# Patient Record
Sex: Female | Born: 1955 | Race: Black or African American | Hispanic: No | Smoking: Former smoker
Health system: Southern US, Community
[De-identification: ages and names within clinical notes are randomized; demographics above are authoritative.]

## PROBLEM LIST (undated history)

## (undated) DIAGNOSIS — I10 Essential (primary) hypertension: Secondary | ICD-10-CM

## (undated) DIAGNOSIS — E039 Hypothyroidism, unspecified: Secondary | ICD-10-CM

## (undated) HISTORY — PX: TONSILLECTOMY: SUR1361

## (undated) HISTORY — PX: ABDOMINAL HYSTERECTOMY: SHX81

## (undated) HISTORY — PX: CORONARY STENT INTERVENTION: CATH118234

## (undated) HISTORY — PX: APPENDECTOMY: SHX54

---

## 2007-12-27 ENCOUNTER — Inpatient Hospital Stay (HOSPITAL_COMMUNITY): Admission: EM | Admit: 2007-12-27 | Discharge: 2007-12-30 | Payer: Self-pay | Admitting: Emergency Medicine

## 2007-12-27 ENCOUNTER — Ambulatory Visit: Payer: Self-pay | Admitting: Hospitalist

## 2007-12-30 ENCOUNTER — Encounter (INDEPENDENT_AMBULATORY_CARE_PROVIDER_SITE_OTHER): Payer: Self-pay | Admitting: Internal Medicine

## 2008-01-06 ENCOUNTER — Ambulatory Visit: Payer: Self-pay | Admitting: Gastroenterology

## 2008-06-19 ENCOUNTER — Telehealth: Payer: Self-pay | Admitting: Internal Medicine

## 2009-10-12 ENCOUNTER — Emergency Department (HOSPITAL_COMMUNITY): Admission: EM | Admit: 2009-10-12 | Discharge: 2009-10-12 | Payer: Self-pay | Admitting: Emergency Medicine

## 2010-05-28 ENCOUNTER — Emergency Department (HOSPITAL_BASED_OUTPATIENT_CLINIC_OR_DEPARTMENT_OTHER)
Admission: EM | Admit: 2010-05-28 | Discharge: 2010-05-28 | Payer: Self-pay | Source: Home / Self Care | Admitting: Emergency Medicine

## 2010-05-28 ENCOUNTER — Ambulatory Visit: Payer: Self-pay | Admitting: Diagnostic Radiology

## 2010-11-22 ENCOUNTER — Emergency Department (HOSPITAL_BASED_OUTPATIENT_CLINIC_OR_DEPARTMENT_OTHER)
Admission: EM | Admit: 2010-11-22 | Discharge: 2010-11-22 | Payer: Self-pay | Source: Home / Self Care | Admitting: Emergency Medicine

## 2010-11-22 ENCOUNTER — Inpatient Hospital Stay (HOSPITAL_COMMUNITY)
Admission: EM | Admit: 2010-11-22 | Discharge: 2010-11-25 | Payer: Self-pay | Source: Home / Self Care | Attending: Internal Medicine | Admitting: Internal Medicine

## 2010-11-23 ENCOUNTER — Encounter (INDEPENDENT_AMBULATORY_CARE_PROVIDER_SITE_OTHER): Payer: Self-pay | Admitting: Internal Medicine

## 2011-01-02 ENCOUNTER — Encounter: Payer: Self-pay | Admitting: Internal Medicine

## 2011-02-20 LAB — GLUCOSE, CAPILLARY
Glucose-Capillary: 154 mg/dL — ABNORMAL HIGH (ref 70–99)
Glucose-Capillary: 291 mg/dL — ABNORMAL HIGH (ref 70–99)
Glucose-Capillary: 308 mg/dL — ABNORMAL HIGH (ref 70–99)

## 2011-02-20 LAB — CBC
HCT: 37.9 % (ref 36.0–46.0)
Hemoglobin: 12.7 g/dL (ref 12.0–15.0)
MCHC: 33.5 g/dL (ref 30.0–36.0)
MCV: 87.9 fL (ref 78.0–100.0)
RBC: 4.31 MIL/uL (ref 3.87–5.11)
RDW: 12.9 % (ref 11.5–15.5)
WBC: 4.1 10*3/uL (ref 4.0–10.5)

## 2011-02-20 LAB — BASIC METABOLIC PANEL
Calcium: 8.8 mg/dL (ref 8.4–10.5)
Creatinine, Ser: 0.75 mg/dL (ref 0.4–1.2)
Sodium: 138 mEq/L (ref 135–145)

## 2011-02-21 LAB — URINALYSIS, ROUTINE W REFLEX MICROSCOPIC
Bilirubin Urine: NEGATIVE
Glucose, UA: >1000 mg/dL — AB
Hgb urine dipstick: NEGATIVE
Ketones, ur: NEGATIVE mg/dL
Protein, ur: NEGATIVE mg/dL

## 2011-02-21 LAB — CBC
HCT: 38.5 % (ref 36.0–46.0)
Hemoglobin: 12.5 g/dL (ref 12.0–15.0)
MCH: 29.4 pg (ref 26.0–34.0)
MCV: 88 fL (ref 78.0–100.0)
RBC: 4.62 MIL/uL (ref 3.87–5.11)
RBC: 4.25 MIL/uL (ref 3.87–5.11)

## 2011-02-21 LAB — DIFFERENTIAL
Basophils Relative: 0 % (ref 0–1)
Eosinophils Absolute: 0.1 10*3/uL (ref 0.0–0.7)
Eosinophils Relative: 1 % (ref 0–5)
Lymphocytes Relative: 29 % (ref 12–46)
Lymphs Abs: 1.5 10*3/uL (ref 0.7–4.0)
Monocytes Absolute: 0.6 10*3/uL (ref 0.1–1.0)
Monocytes Relative: 12 % (ref 3–12)
Neutro Abs: 3.2 10*3/uL (ref 1.7–7.7)

## 2011-02-21 LAB — GLUCOSE, CAPILLARY
Glucose-Capillary: 394 mg/dL — ABNORMAL HIGH (ref 70–99)
Glucose-Capillary: 193 mg/dL — ABNORMAL HIGH (ref 70–99)
Glucose-Capillary: 410 mg/dL — ABNORMAL HIGH (ref 70–99)

## 2011-02-21 LAB — URINE CULTURE
Colony Count: 80000
Colony Count: 45000
Culture  Setup Time: 201112132132
Culture  Setup Time: 201112140541

## 2011-02-21 LAB — CARDIAC PANEL(CRET KIN+CKTOT+MB+TROPI)
Relative Index: 1.1 (ref 0.0–2.5)
Relative Index: INVALID (ref 0.0–2.5)
Total CK: 99 U/L (ref 7–177)
Troponin I: 0.01 ng/mL (ref 0.00–0.06)

## 2011-02-21 LAB — COMPREHENSIVE METABOLIC PANEL
ALT: 16 U/L (ref 0–35)
AST: 13 U/L (ref 0–37)
BUN: 11 mg/dL (ref 6–23)
CO2: 28 mEq/L (ref 19–32)
Calcium: 9.6 mg/dL (ref 8.4–10.5)
Chloride: 102 mEq/L (ref 96–112)
Creatinine, Ser: 0.7 mg/dL (ref 0.4–1.2)
GFR calc non Af Amer: >60 mL/min (ref >60)
Total Protein: 7 g/dL (ref 6.0–8.3)

## 2011-02-21 LAB — BASIC METABOLIC PANEL
BUN: 9 mg/dL (ref 6–23)
CO2: 29 mEq/L (ref 19–32)
Calcium: 9.2 mg/dL (ref 8.4–10.5)
Creatinine, Ser: 0.81 mg/dL (ref 0.4–1.2)
GFR calc Af Amer: >60 mL/min (ref >60)
Glucose, Bld: 312 mg/dL — ABNORMAL HIGH (ref 70–99)

## 2011-02-21 LAB — PROTIME-INR: INR: 0.95 (ref 0.00–1.49)

## 2011-02-21 LAB — RAPID URINE DRUG SCREEN, HOSP PERFORMED
Opiates: NOT DETECTED
Tetrahydrocannabinol: POSITIVE — AB

## 2011-02-21 LAB — MAGNESIUM: Magnesium: 2 mg/dL (ref 1.5–2.5)

## 2011-02-21 LAB — LIPASE, BLOOD: Lipase: 26 U/L (ref 11–59)

## 2011-02-21 LAB — POCT CARDIAC MARKERS: Troponin i, poc: <0.05 ng/mL (ref 0.00–0.09)

## 2011-02-21 LAB — URINE MICROSCOPIC-ADD ON

## 2011-02-26 LAB — COMPREHENSIVE METABOLIC PANEL
ALT: 18 U/L (ref 0–35)
Alkaline Phosphatase: 86 U/L (ref 39–117)
CO2: 28 mEq/L (ref 19–32)
Glucose, Bld: 196 mg/dL — ABNORMAL HIGH (ref 70–99)
Potassium: 3.7 mEq/L (ref 3.5–5.1)
Sodium: 140 mEq/L (ref 135–145)
Total Protein: 6.9 g/dL (ref 6.0–8.3)

## 2011-02-26 LAB — URINALYSIS, ROUTINE W REFLEX MICROSCOPIC
Bilirubin Urine: NEGATIVE
Bilirubin Urine: NEGATIVE
Glucose, UA: 250 mg/dL — AB
Glucose, UA: 500 mg/dL — AB
Hgb urine dipstick: NEGATIVE
Hgb urine dipstick: NEGATIVE
Ketones, ur: 15 mg/dL — AB
Ketones, ur: NEGATIVE mg/dL
Nitrite: NEGATIVE
Protein, ur: 30 mg/dL — AB
pH: 6 (ref 5.0–8.0)

## 2011-02-26 LAB — DIFFERENTIAL
Basophils Relative: 1 % (ref 0–1)
Eosinophils Absolute: 0 10*3/uL (ref 0.0–0.7)
Monocytes Relative: 18 % — ABNORMAL HIGH (ref 3–12)
Neutrophils Relative %: 60 % (ref 43–77)

## 2011-02-26 LAB — URINE CULTURE
Colony Count: NO GROWTH
Culture: NO GROWTH

## 2011-02-26 LAB — CBC
Hemoglobin: 12.7 g/dL (ref 12.0–15.0)
RBC: 4.33 MIL/uL (ref 3.87–5.11)
RDW: 12.9 % (ref 11.5–15.5)
WBC: 3.7 10*3/uL — ABNORMAL LOW (ref 4.0–10.5)

## 2011-02-26 LAB — HEMOCCULT GUIAC POC 1CARD (OFFICE): Fecal Occult Bld: POSITIVE

## 2011-02-26 LAB — URINE MICROSCOPIC-ADD ON

## 2011-04-28 NOTE — Discharge Summary (Signed)
Catherine Bowman, DUTT NO.:  [id number]   MEDICAL RECORD NO.:  [id number]          PATIENT TYPE:  INP   LOCATION:  4741                         FACILITY:  [facility]   PHYSICIAN:  Rosanna Randy, MDDATE OF BIRTH:  Jun 12, 1956   DATE OF ADMISSION:  12/27/2007  DATE OF DISCHARGE:  12/30/2007                               DISCHARGE SUMMARY   DISCHARGE DIAGNOSES:  1. Hypertension.  2. Diabetes mellitus, type 2.  3. Obstructive sleep apnea.  4. History of hysterectomy.  5. History of gastrointestinal bleed in the past.   DISCHARGE MEDICATIONS:  1. Levemir 30 units subcutaneously at bedtime.  2. Metformin 500 mg one tablet by mouth twice a day.  3. Actos 45 mg one tablet by mouth once a day.  4. Fish oil one tablet by mouth daily.  5. Lipitor 80 mg by mouth daily.  6. Benazepril 10 mg one tablet by mouth once a day.  7. Synthroid 135 mcg by mouth daily.  8. Multivitamin one tablet by mouth daily.  9. Enteric-coated aspirin 81 mg by mouth daily.  10.Meloxicam 50 mg one tablet by mouth daily.  11.Prilosec 40 mg one tablet by mouth twice a day.   DISPOSITION AND FOLLOWUP:  The patient was discharge in stable condition  with a followup appointment with her primary care physician, Dr. Emmaline Life, on January 13, 2008 at 9:15 in the morning.  It will be important  at that moment to have a reconciliation of the medications that the  patient was using before hospitalization and after hospitalization. Will  be also important to counsel the pt regarding education and compliance  with her medications.  It will be also important to recheck in the  future a hemoglobin A1c for evaluation of the new doses of her insulin  regarding response to the blood glucose control.  It will be important  to check a fasting lipid profile to reevaluate cholesterol levels, and  it will also be important to check in 6 months another ultrasound of the  abdomen with special visualization of  the liver regarding a little cyst  that we found on an ultrasound performed in this hospitalization.   PROCEDURES PERFORMED:  The patient had a 2-D ECHO: with an overall left  ventricular systolic function that was normal.  Left ventricular  ejection fraction was estimated to be between 55-60%.  There were no  left ventricular regional wall motion abnormalities.  The patient also received a CHEST X-RAY: that was negative for acute  cardiopulmonary disease.  The patient had an EKG: that demonstrated sinus rhythm with first-degree  atrioventricular block that is not new.  The patient received an ABDOMEN ULTRASOUND: that demonstrated no acute  abdominal findings, with a small hypoechoic focus within the liver that  most likely represents a small hemangioma versus local fatty  infiltration less likely.  Also, it will be important to consider a  small hepatic adenoma. There was also the presence of a simple cyst  within the left kidney.  ABDOMINAL X-RAY: demonstrated a moderate amount of feces throughout the  colon and calcification  to right of L3-L4 that could be phlebolith  versus right ureteral calculus.  The patient had RIGHT SHOULDER X-RAY: regarding the pain that she was  presenting during this hospitalization, that was completely negative for  any abnormality.  The patient had an ENDOSCOPY: that demonstrated Mallory-Weiss tears and  duodenitis with mild gastritis.   CONSULTATIONS:  Gastroenterology, Barbette Hair. Arlyce Dice, M.D., [facility]   BRIEF HISTORY AND PHYSICAL:  Ms. Catherine Bowman is a 55 year old African American  woman with past medical history of hypertension, diabetes mellitus type  2, obstructive sleep apnea, and a remote history of gastrointestinal  bleed who now presents with chest pain 2 days prior to admission and  vomiting with hematemesis 3 times 1 day prior to admission and the  morning of admission.  The chest pain is actually described mainly in  the epigastric area involving the  whole chest, radiating into the back,  8/10 in intensity, described as a sharp pain regarding quality.  She had  associated shortness of breath, nausea and vomiting, and also  diaphoresis.  The patient denies any asymmetric swelling of the legs and  reports that a recent Doppler was negative for SVT and DVTs but  demonstrated positive Baker's cyst bilaterally.   PHYSICAL EXAMINATION:  VITAL SIGNS:  Temperature 97.8, blood pressure  135/75, heart rate 59, respiratory rate 20, oxygen saturation 99%.  HEENT:  Eyes anicteric.  NECK:  Demonstrating a slightly hyperthyromegaly, no bruits.  RESPIRATORY:  Positive rhonchi.  Other than that, clear to auscultation  with good air movement.  CARDIOVASCULAR:  Regular rate and rhythm.  No murmur, gallop, or rubs.  The pain was slightly reproducible by palpation across the chest but  mostly present in the middle of the chest close to the epigastric  section.  ABDOMEN:  Obese.  Positive bowel sounds.  Soft.  Positive tender to  palpation on the right upper quadrant and epigastric pain.  EXTREMITIES:  No edema.  Negative Homans sign.  Good pulses bilaterally.   LABORATORY DATA:  On admission, the patient's labs demonstrated a sodium  of 137, potassium 4.0, chloride 103, bicarb 29, BUN 12, creatinine 0.71,  glucose 253.  CBC:  Hemoglobin 11.2, white blood cells 9.1, platelets  155.  Point-of-care markers negative x1.  Cardiac enzymes negative x1.  Rapid strep test was negative.  Influenza was negative.  TSH 15.3.   Chest x-ray without acute findings.   HOSPITAL COURSE BY PROBLEM:  Problem 1.  Chest pain.  Most likely  related to a gastrointestinal source since the patient's  electrocardiogram only demonstrated first-degree AV block that was not  new and no other findings for acute ischemia.  Also, the patient had a 2-  D echocardiogram which was completely normal and had 3 sets of cardiac  enzymes that were also negative.  Also, because of the  location of the  pain that was most likely in the epigastric region and the association  with nausea, vomiting, and hematemesis, we decided to consult  gastroenterology in order to have an endoscopy and rule out any peptic  ulcer disease, gastritis, or esophagitis.  After the endoscopy was  performed, we discovered that the patient had Mallory-Weiss tears in the  esophagus that were not actively bleeding, and also duodenitis.  The  patient was started on Prilosec 40mg  twice a day and was instructed to  avoid citric juices and spicy food for 1 week in order to achieve a  complete cure of the ulcers in the esophagus.  At the moment that we  discharged the patient, the patient denies nausea, denies vomiting, and  the chest pain was completely resolved.   Problem 2.  Diabetes mellitus.  The patient was started during the  hospitalization on a sliding scale insulin with meal coverage and Lantus  at night.  The blood sugar was 243 with CBGs around 200s.  We actually  ordered a hemoglobin A1c that demonstrated 11.3 level, showing poorly  controlled diabetes.  We decided to add glipizide 5 mg by mouth twice a  day for better control, and we increased the doses of her medications at  the moment that we discharge her, with a followup appointment with her  primary care physician.  At the moment of discharge, the patient was  using metformin 500 mg one tablet by mouth twice a day and Actos 45 mg  one tablet by mouth once a day.  She was also using Levemir insulin 30  units subcutaneously at bedtime.   Problem 3.  Asthma.  The patient was completely stable regarding this  problem during the hospitalization and was receiving her home  medications with Xopenex every 6 hours p.r.n.   Problem 4.  Hypertension.  The blood pressure was pretty well-  controlled, and we just continued the same regimen that she was using at  home.   Problem 5.  Hyperlipidemia.  The patient was actually using Lipitor 80   mg.  We ordered a fasting lipid profile and decided that the primary  care physician at her next appointment could address any changes.  During this hospitalization, we checked the liver function regarding the  use of this medication that was completely normal.   Problem 6.  Hypothyroidism with a TSH of 15.3.  The patient was using  Levothyroxine, just 25 mcg at that moment.  With this high level of TSH,  we increased the patient to 135 mcg by mouth daily, with a followup  appointment with her primary care physician regarding adjustment of the  medication for the hypothyroidism.  The patient was completely  asymptomatic for hypothyroidism during the hospitalization.   Problem 7.  Osteoarthritis: We decided to change the naproxen 500 mg  that the patient was using for the OA, to meloxicam 15mg , 1 tablet by  mouth daily and we also started the patient on Prilosec 40 mg 1 tablet  by mouth twice a day.   VITAL SIGNS AND LABS ON DISCHARGE:  Temperature 97, blood pressure  106/55, heart rate 58, respiratory rate 20, oxygen saturation 100% on  room air.   CBC showed hemoglobin 12.1, hematocrit 36, platelets 138, white blood  cells 4.5.  Sodium 139, potassium 4.0, chloride 102, bicarb 31, BUN 7,  creatinine 0.81.  The glucose at the moment of discharge was 200.  Calcium 9.2.      Rosanna Randy, MD  Electronically Signed     CEM/MEDQ  D:  01/25/2008  T:  01/27/2008  Job:  [id number]   cc:   Emmaline Life, M.D.

## 2011-05-01 ENCOUNTER — Emergency Department (HOSPITAL_BASED_OUTPATIENT_CLINIC_OR_DEPARTMENT_OTHER)
Admission: EM | Admit: 2011-05-01 | Discharge: 2011-05-01 | Disposition: A | Discharge status: Home / Self Care | Payer: Self-pay | Attending: Emergency Medicine | Admitting: Emergency Medicine

## 2011-05-01 ENCOUNTER — Emergency Department (INDEPENDENT_AMBULATORY_CARE_PROVIDER_SITE_OTHER): Payer: Self-pay

## 2011-05-01 DIAGNOSIS — J45909 Unspecified asthma, uncomplicated: Secondary | ICD-10-CM | POA: Insufficient documentation

## 2011-05-01 DIAGNOSIS — I10 Essential (primary) hypertension: Secondary | ICD-10-CM | POA: Insufficient documentation

## 2011-05-01 DIAGNOSIS — E119 Type 2 diabetes mellitus without complications: Secondary | ICD-10-CM | POA: Insufficient documentation

## 2011-05-01 DIAGNOSIS — J069 Acute upper respiratory infection, unspecified: Secondary | ICD-10-CM | POA: Insufficient documentation

## 2011-05-01 DIAGNOSIS — R05 Cough: Secondary | ICD-10-CM

## 2011-05-01 DIAGNOSIS — R0989 Other specified symptoms and signs involving the circulatory and respiratory systems: Secondary | ICD-10-CM

## 2011-05-01 DIAGNOSIS — J40 Bronchitis, not specified as acute or chronic: Secondary | ICD-10-CM | POA: Insufficient documentation

## 2011-08-08 ENCOUNTER — Emergency Department (HOSPITAL_BASED_OUTPATIENT_CLINIC_OR_DEPARTMENT_OTHER)
Admission: EM | Admit: 2011-08-08 | Discharge: 2011-08-08 | Disposition: A | Discharge status: Home / Self Care | Payer: Self-pay | Attending: Emergency Medicine | Admitting: Emergency Medicine

## 2011-08-08 DIAGNOSIS — I10 Essential (primary) hypertension: Secondary | ICD-10-CM | POA: Insufficient documentation

## 2011-08-08 DIAGNOSIS — R5381 Other malaise: Secondary | ICD-10-CM | POA: Insufficient documentation

## 2011-08-08 DIAGNOSIS — R5383 Other fatigue: Secondary | ICD-10-CM | POA: Insufficient documentation

## 2011-08-08 DIAGNOSIS — E1169 Type 2 diabetes mellitus with other specified complication: Secondary | ICD-10-CM | POA: Insufficient documentation

## 2011-08-08 DIAGNOSIS — E039 Hypothyroidism, unspecified: Secondary | ICD-10-CM | POA: Insufficient documentation

## 2011-08-08 DIAGNOSIS — E162 Hypoglycemia, unspecified: Secondary | ICD-10-CM

## 2011-08-08 DIAGNOSIS — R51 Headache: Secondary | ICD-10-CM | POA: Insufficient documentation

## 2011-08-08 DIAGNOSIS — R209 Unspecified disturbances of skin sensation: Secondary | ICD-10-CM | POA: Insufficient documentation

## 2011-08-08 DIAGNOSIS — M79609 Pain in unspecified limb: Secondary | ICD-10-CM

## 2011-08-08 HISTORY — DX: Essential (primary) hypertension: I10

## 2011-08-08 HISTORY — DX: Hypothyroidism, unspecified: E03.9

## 2011-08-08 LAB — GLUCOSE, CAPILLARY
Glucose-Capillary: 49 mg/dL — ABNORMAL LOW (ref 70–99)
Glucose-Capillary: 130 mg/dL — ABNORMAL HIGH (ref 70–99)

## 2011-08-08 MED ORDER — METOCLOPRAMIDE HCL 5 MG/ML IJ SOLN
10.0000 mg | Freq: Once | INTRAMUSCULAR | Status: AC
  Administered 2011-08-08: 10 mg via INTRAMUSCULAR
  Filled 2011-08-08: qty 2

## 2011-08-08 MED ORDER — KETOROLAC TROMETHAMINE 60 MG/2ML IM SOLN
60.0000 mg | Freq: Once | INTRAMUSCULAR | Status: AC
  Administered 2011-08-08: 60 mg via INTRAMUSCULAR
  Filled 2011-08-08: qty 2

## 2011-08-08 MED ORDER — DIPHENHYDRAMINE HCL 50 MG/ML IJ SOLN
25.0000 mg | Freq: Once | INTRAMUSCULAR | Status: AC
  Administered 2011-08-08: 25 mg via INTRAMUSCULAR
  Filled 2011-08-08: qty 1

## 2011-08-08 NOTE — ED Notes (Signed)
Hx of diabetes; states both hands numb and tingling and right leg gives way and left shoulder pain; takes nueurontin for neuropathy; drove self here

## 2011-08-08 NOTE — ED Notes (Signed)
Meal tray provided.

## 2011-08-08 NOTE — ED Notes (Signed)
Pt noted to be shaky.  PO OF with sugar, crackers an peanut butter provided.  Skin warm and dry.  She states she did not eat breakfast this am.

## 2011-08-08 NOTE — ED Notes (Signed)
Pt states that she has been having trouble controlling her blood sugar for the past month since her doctor increased her dose of lantus from 60 to 80.  She says that her blood sugar is usually high, but she has been having hypoglycemic episodes in the morning.  She takes her lantus at night.  She also takes metformen.  She was prescribed novolog, but does not take it.  She has been feeling weak and shaky in the morning which has made it difficult for her to prepare breakfast for herself.  She states that she had a piece of cake and a few peppermints for breakfast this morning.

## 2011-08-08 NOTE — ED Provider Notes (Signed)
History     CSN: [id number] Arrival date & time: 08/08/2011 10:43 AM  Chief Complaint  Patient presents with  . Extremity Weakness   HPI Comments: Pt states that she has had a lot of tingling in her extremities which often send shooting pain in her extremities and it make her feel like her arms and legs are going to give out on someone pt states that this has been going on over the last month:pt states that she has been feeling bad since they put her on the neurontin and her insulin was changed:pt states that has also had a headache for the last couple of days which has been resolved when she takes aleve, but she has it currently  Patient is a 55 y.o. female presenting with extremity weakness. The history is provided by the patient. No language interpreter was used.  Extremity Weakness This is a recurrent problem. The current episode started today. The problem occurs constantly. The problem has been unchanged. Pertinent negatives include no abdominal pain, fever, neck pain, numbness, rash, sore throat, vomiting or weakness. The symptoms are aggravated by nothing.    Past Medical History  Diagnosis Date  . Hypothyroid   . Diabetes mellitus   . Hypertension   . Asthma     Past Surgical History  Procedure Date  . Tonsillectomy   . Appendectomy   . Abdominal hysterectomy     No family history on file.  History  Substance Use Topics  . Smoking status: Former Games developer  . Smokeless tobacco: Not on file  . Alcohol Use: No    OB History    Grav Para Term Preterm Abortions TAB SAB Ect Mult Living                  Review of Systems  Constitutional: Negative for fever.  HENT: Negative for sore throat and neck pain.   Gastrointestinal: Negative for vomiting and abdominal pain.  Musculoskeletal: Positive for extremity weakness.  Skin: Negative for rash.  Neurological: Negative for weakness and numbness.  All other systems reviewed and are negative.    Physical Exam  BP 128/84   Pulse 67  Temp(Src) 98.5 F (36.9 C) (Oral)  Resp 20  SpO2 100%  Physical Exam  Nursing note and vitals reviewed. Constitutional: She is oriented to person, place, and time. She appears well-developed and well-nourished.  HENT:  Head: Normocephalic and atraumatic.  Eyes: Pupils are equal, round, and reactive to light.  Neck: Normal range of motion. Neck supple.  Cardiovascular: Normal rate and regular rhythm.   Pulmonary/Chest: Effort normal and breath sounds normal.  Abdominal: Soft. Bowel sounds are normal.  Musculoskeletal: Normal range of motion.  Neurological: She is alert and oriented to person, place, and time. She exhibits normal muscle tone.  Skin: Skin is warm and dry.  Psychiatric: She has a normal mood and affect.    ED Course  Procedures  MDM Pt is feeling better at this time:pt feed after the low blood sugar:pt headache has resolved:based on history symptoms likely related to paratesias:discussed with pt proper food intake etc.. With her lantus:pt verbalizes understanding      Teressa Lower, NP 08/08/11 [phone]

## 2011-08-10 NOTE — ED Provider Notes (Signed)
Medical screening examination/treatment/procedure(s) were performed by non-physician practitioner and as supervising physician I was immediately available for consultation/collaboration.  Gerhard Munch, MD, MA  Gerhard Munch, MD 08/10/11 [phone]

## 2011-09-01 LAB — I-STAT 8, (EC8 V) (CONVERTED LAB)
Bicarbonate: 27.1 — ABNORMAL HIGH
Glucose, Bld: 384 — ABNORMAL HIGH
HCT: 42
Hemoglobin: 14.3
Operator id: 198171
Sodium: 135
TCO2: 28
pCO2, Ven: 35.8 — ABNORMAL LOW

## 2011-09-01 LAB — URINALYSIS, ROUTINE W REFLEX MICROSCOPIC
Bilirubin Urine: NEGATIVE
Nitrite: NEGATIVE
Specific Gravity, Urine: 1.025
Urobilinogen, UA: 1

## 2011-09-01 LAB — HEPATIC FUNCTION PANEL
Albumin: 3.3 — ABNORMAL LOW
Alkaline Phosphatase: 64
Total Protein: 5.4 — ABNORMAL LOW

## 2011-09-01 LAB — PROTIME-INR
INR: 1
Prothrombin Time: 12.9

## 2011-09-01 LAB — APTT: aPTT: 30

## 2011-09-01 LAB — DIFFERENTIAL
Basophils Absolute: 0
Basophils Absolute: 0
Basophils Absolute: 0
Basophils Relative: 0
Basophils Relative: 0
Basophils Relative: 0
Eosinophils Relative: 1
Lymphocytes Relative: 15
Lymphocytes Relative: 15
Monocytes Absolute: 0.7
Monocytes Absolute: 0.9
Neutro Abs: 5.3
Neutro Abs: 6.3
Neutro Abs: 7.1
Neutrophils Relative %: 78 — ABNORMAL HIGH

## 2011-09-01 LAB — COMPREHENSIVE METABOLIC PANEL
Albumin: 3.8
Alkaline Phosphatase: 71
BUN: 12
Calcium: 9.1
Glucose, Bld: 253 — ABNORMAL HIGH
Potassium: 4
Total Protein: 6.1

## 2011-09-01 LAB — CBC
HCT: 34.2 — ABNORMAL LOW
HCT: 36 — ABNORMAL LOW
Hemoglobin: 11.5 — ABNORMAL LOW
Hemoglobin: 11.7 — ABNORMAL LOW
Hemoglobin: 12.1 — ABNORMAL LOW
MCHC: 33.5
MCHC: 33.6
MCHC: 34
Platelets: 131 — ABNORMAL LOW
Platelets: 133 — ABNORMAL LOW
Platelets: 138 — ABNORMAL LOW
Platelets: 155
RBC: 3.85 — ABNORMAL LOW
RBC: 3.96 — ABNORMAL LOW
RBC: 4.09 — ABNORMAL LOW
RDW: 13.7
RDW: 13.9
RDW: 14
RDW: 14.2
WBC: 4.5
WBC: 7

## 2011-09-01 LAB — RAPID STREP SCREEN (MED CTR MEBANE ONLY): Streptococcus, Group A Screen (Direct): NEGATIVE

## 2011-09-01 LAB — CULTURE, BLOOD (ROUTINE X 2)
Culture: NO GROWTH
Culture: NO GROWTH

## 2011-09-01 LAB — BASIC METABOLIC PANEL
BUN: 10
CO2: 30
Calcium: 8.8
Calcium: 8.9
Creatinine, Ser: 0.7
Creatinine, Ser: 0.72
GFR calc Af Amer: >60
GFR calc Af Amer: >60
GFR calc non Af Amer: >60
GFR calc non Af Amer: >60
Potassium: 4
Sodium: 139

## 2011-09-01 LAB — CARDIAC PANEL(CRET KIN+CKTOT+MB+TROPI)
CK, MB: 1
CK, MB: 1.1
Total CK: 72
Total CK: 75
Troponin I: <0.01

## 2011-09-01 LAB — TSH: TSH: 15.299 — ABNORMAL HIGH

## 2011-09-01 LAB — CK TOTAL AND CKMB (NOT AT ARMC)
CK, MB: 1.3
Relative Index: INVALID
Total CK: 82

## 2011-09-01 LAB — LIPID PANEL
Cholesterol: 146
LDL Cholesterol: 89
VLDL: 18

## 2011-09-01 LAB — HEMOGLOBIN A1C
Hgb A1c MFr Bld: 11.3 — ABNORMAL HIGH
Mean Plasma Glucose: 325

## 2011-09-01 LAB — POCT CARDIAC MARKERS: CKMB, poc: <1 — ABNORMAL LOW

## 2011-09-01 LAB — MONONUCLEOSIS SCREEN: Mono Screen: NEGATIVE

## 2011-09-01 LAB — POCT I-STAT CREATININE: Operator id: 198171

## 2011-09-01 LAB — LIPASE, BLOOD: Lipase: 21

## 2011-09-01 LAB — TROPONIN I: Troponin I: 0.01

## 2012-01-01 ENCOUNTER — Emergency Department (HOSPITAL_BASED_OUTPATIENT_CLINIC_OR_DEPARTMENT_OTHER)
Admission: EM | Admit: 2012-01-01 | Discharge: 2012-01-01 | Disposition: A | Discharge status: Home / Self Care | Payer: Self-pay | Attending: Emergency Medicine | Admitting: Emergency Medicine

## 2012-01-01 ENCOUNTER — Encounter (HOSPITAL_BASED_OUTPATIENT_CLINIC_OR_DEPARTMENT_OTHER): Payer: Self-pay | Admitting: Family Medicine

## 2012-01-01 ENCOUNTER — Emergency Department (INDEPENDENT_AMBULATORY_CARE_PROVIDER_SITE_OTHER): Payer: Self-pay

## 2012-01-01 DIAGNOSIS — J45909 Unspecified asthma, uncomplicated: Secondary | ICD-10-CM | POA: Insufficient documentation

## 2012-01-01 DIAGNOSIS — I10 Essential (primary) hypertension: Secondary | ICD-10-CM | POA: Insufficient documentation

## 2012-01-01 DIAGNOSIS — H9209 Otalgia, unspecified ear: Secondary | ICD-10-CM | POA: Insufficient documentation

## 2012-01-01 DIAGNOSIS — R509 Fever, unspecified: Secondary | ICD-10-CM

## 2012-01-01 DIAGNOSIS — E119 Type 2 diabetes mellitus without complications: Secondary | ICD-10-CM | POA: Insufficient documentation

## 2012-01-01 DIAGNOSIS — M549 Dorsalgia, unspecified: Secondary | ICD-10-CM | POA: Insufficient documentation

## 2012-01-01 DIAGNOSIS — B9789 Other viral agents as the cause of diseases classified elsewhere: Secondary | ICD-10-CM | POA: Insufficient documentation

## 2012-01-01 DIAGNOSIS — R05 Cough: Secondary | ICD-10-CM

## 2012-01-01 DIAGNOSIS — E039 Hypothyroidism, unspecified: Secondary | ICD-10-CM | POA: Insufficient documentation

## 2012-01-01 MED ORDER — HYDROCOD POLST-CHLORPHEN POLST 10-8 MG/5ML PO LQCR: 5.0000 mL | Freq: Two times a day (BID) | ORAL | Status: DC | PRN

## 2012-01-01 MED ORDER — HYDROCOD POLST-CHLORPHEN POLST 10-8 MG/5ML PO LQCR
5.0000 mL | Freq: Once | ORAL | Status: AC
  Administered 2012-01-01: 5 mL via ORAL
  Filled 2012-01-01: qty 5

## 2012-01-01 NOTE — ED Notes (Addendum)
Pt c/o productive cough, right ear pain, body aches, back pain and abdominal pain x 2 wks. Pt sts "I hurt all over". Pt denies fever, n/v/d. Pt sts she is out of meds and [facility] is working to help her get her meds.

## 2012-01-01 NOTE — ED Provider Notes (Addendum)
History     CSN: [id number]  Arrival date & time 01/01/12  2952   First MD Initiated Contact with Patient 01/01/12 1121      Chief Complaint  Patient presents with  . Otalgia  . Back Pain    (Consider location/radiation/quality/duration/timing/severity/associated sxs/prior treatment) HPI Complains of cough sore throat fever diffuse myalgias back pain and right ear pain onset 2 weeks ago maximum temperature 101 which was 4 days ago. Treated with Tussiin DM, diabetic formula without relief. Cough is nonproductive. No other associated symptoms. Last check blood sugar 8:30 this morning which was 129. Symptoms not made better or worse by anything. Past Medical History  Diagnosis Date  . Hypothyroid   . Diabetes mellitus   . Hypertension   . Asthma     Past Surgical History  Procedure Date  . Tonsillectomy   . Appendectomy   . Abdominal hysterectomy     No family history on file.  History  Substance Use Topics  . Smoking status: Former Games developer  . Smokeless tobacco: Not on file  . Alcohol Use: No    OB History    Grav Para Term Preterm Abortions TAB SAB Ect Mult Living                  Review of Systems  Constitutional: Negative.   HENT: Positive for hearing loss and sore throat.        Diminished hearing from right ear, right ear pain  Respiratory: Positive for cough.   Cardiovascular: Negative.   Gastrointestinal: Negative.   Musculoskeletal: Positive for myalgias.  Skin: Negative.   Neurological: Negative.   Hematological: Negative.   Psychiatric/Behavioral: Negative.     Allergies  Review of patient's allergies indicates no known allergies.  Home Medications   Current Outpatient Rx  Name Route Sig Dispense Refill  . PIOGLITAZONE HCL 15 MG PO TABS Oral Take 15 mg by mouth daily.    Marland Kitchen BENAZEPRIL HCL 40 MG PO TABS Oral Take 40 mg by mouth daily.      Marland Kitchen GABAPENTIN 100 MG PO CAPS Oral Take 100 mg by mouth 3 (three) times daily.      Marland Kitchen GLIPIZIDE 5 MG PO  TABS Oral Take 5 mg by mouth 2 (two) times daily before a meal.      . INSULIN GLARGINE 100 UNIT/ML [place] SOLN Subcutaneous Inject 80 Units into the skin at bedtime.      . INSULIN LISPRO (HUMAN) 100 UNIT/ML [place] SOLN Subcutaneous Inject 4 Units into the skin 3 (three) times daily before meals.      Marland Kitchen LEVOTHYROXINE SODIUM 150 MCG PO TABS Oral Take 150 mcg by mouth daily.        BP 123/75  Pulse 67  Temp(Src) 97.9 F (36.6 C) (Oral)  Resp 20  Ht 5\' 1"  (1.549 m)  Wt 180 lb (81.647 kg)  BMI 34.01 kg/m2  SpO2 99%  Physical Exam  Nursing note and vitals reviewed. Constitutional: She appears well-developed and well-nourished. No distress.  HENT:  Head: Normocephalic and atraumatic.  Right Ear: External ear normal.  Left Ear: External ear normal.  Mouth/Throat: Oropharynx is clear and moist.       Bilateral tympanic membranes normal  Eyes: Conjunctivae are normal. Pupils are equal, round, and reactive to light.  Neck: Neck supple. No tracheal deviation present. No thyromegaly present.  Cardiovascular: Normal rate, regular rhythm and normal heart sounds.   No murmur heard. Pulmonary/Chest: Effort normal and breath sounds normal.  Abdominal: Soft. Bowel sounds are normal. She exhibits no distension. There is no tenderness.  Musculoskeletal: Normal range of motion. She exhibits no edema and no tenderness.  Neurological: She is alert. Coordination normal.  Skin: Skin is warm and dry. No rash noted.  Psychiatric: She has a normal mood and affect.    ED Course  Procedures (including critical care time) Feels improved after treatment with Tussionex. Labs Reviewed - No data to display No results found. Results for orders placed during the hospital encounter of 08/08/11  GLUCOSE, CAPILLARY      Component Value Range   Glucose-Capillary 49 (*) 70 - 99 (mg/dL)  GLUCOSE, CAPILLARY      Component Value Range   Glucose-Capillary 130 (*) 70 - 99 (mg/dL)   Dg Chest 2 View  5/78/4696   *RADIOLOGY REPORT*  Clinical Data: Cough and fever  CHEST - 2 VIEW  Comparison: 05/01/2011  Findings: Lung volumes are low.  The patchy airspace disease seen in the right upper lung on the previous study is resolved.  No evidence for edema or focal airspace consolidation on today's study. Cardiopericardial silhouette is at upper limits of normal for size. Imaged bony structures of the thorax are intact.  IMPRESSION: Low lung volumes without acute cardiopulmonary findings.  Original Report Authenticated By: ERIC A. MANSELL, M.D.     No diagnosis found.    MDM  Suspect influenza Plan prescription Tussionex [facility] if not improved one week Diagnosis viral respiratory illness        Doug Sou, MD 01/01/12 1240  Doug Sou, MD 01/01/12 2009

## 2013-03-17 ENCOUNTER — Emergency Department (HOSPITAL_BASED_OUTPATIENT_CLINIC_OR_DEPARTMENT_OTHER): Payer: Medicare Other

## 2013-03-17 ENCOUNTER — Emergency Department (HOSPITAL_BASED_OUTPATIENT_CLINIC_OR_DEPARTMENT_OTHER)
Admission: EM | Admit: 2013-03-17 | Discharge: 2013-03-17 | Disposition: A | Discharge status: Home / Self Care | Payer: Medicare Other | Attending: Emergency Medicine | Admitting: Emergency Medicine

## 2013-03-17 ENCOUNTER — Encounter (HOSPITAL_BASED_OUTPATIENT_CLINIC_OR_DEPARTMENT_OTHER): Payer: Self-pay | Admitting: *Deleted

## 2013-03-17 DIAGNOSIS — Z794 Long term (current) use of insulin: Secondary | ICD-10-CM | POA: Insufficient documentation

## 2013-03-17 DIAGNOSIS — Y929 Unspecified place or not applicable: Secondary | ICD-10-CM | POA: Insufficient documentation

## 2013-03-17 DIAGNOSIS — E119 Type 2 diabetes mellitus without complications: Secondary | ICD-10-CM | POA: Insufficient documentation

## 2013-03-17 DIAGNOSIS — Z79899 Other long term (current) drug therapy: Secondary | ICD-10-CM | POA: Insufficient documentation

## 2013-03-17 DIAGNOSIS — S0993XA Unspecified injury of face, initial encounter: Secondary | ICD-10-CM | POA: Insufficient documentation

## 2013-03-17 DIAGNOSIS — J45909 Unspecified asthma, uncomplicated: Secondary | ICD-10-CM | POA: Insufficient documentation

## 2013-03-17 DIAGNOSIS — I10 Essential (primary) hypertension: Secondary | ICD-10-CM | POA: Insufficient documentation

## 2013-03-17 DIAGNOSIS — Y939 Activity, unspecified: Secondary | ICD-10-CM | POA: Insufficient documentation

## 2013-03-17 DIAGNOSIS — Z87891 Personal history of nicotine dependence: Secondary | ICD-10-CM | POA: Insufficient documentation

## 2013-03-17 DIAGNOSIS — J329 Chronic sinusitis, unspecified: Secondary | ICD-10-CM

## 2013-03-17 DIAGNOSIS — J3489 Other specified disorders of nose and nasal sinuses: Secondary | ICD-10-CM | POA: Insufficient documentation

## 2013-03-17 DIAGNOSIS — X58XXXA Exposure to other specified factors, initial encounter: Secondary | ICD-10-CM | POA: Insufficient documentation

## 2013-03-17 DIAGNOSIS — M436 Torticollis: Secondary | ICD-10-CM

## 2013-03-17 DIAGNOSIS — E039 Hypothyroidism, unspecified: Secondary | ICD-10-CM | POA: Insufficient documentation

## 2013-03-17 MED ORDER — AMOXICILLIN 500 MG PO CAPS: 500.0000 mg | ORAL_CAPSULE | Freq: Three times a day (TID) | ORAL | Status: DC

## 2013-03-17 MED ORDER — HYDROCODONE-ACETAMINOPHEN 5-325 MG PO TABS: 2.0000 | ORAL_TABLET | ORAL | Status: DC | PRN

## 2013-03-17 MED ORDER — DIAZEPAM 5 MG PO TABS: 5.0000 mg | ORAL_TABLET | Freq: Two times a day (BID) | ORAL | Status: DC

## 2013-03-17 NOTE — ED Provider Notes (Signed)
History     CSN: [id number]  Arrival date & time 03/17/13  1647   First MD Initiated Contact with Patient 03/17/13 1731      Chief Complaint  Patient presents with  . URI    (Consider location/radiation/quality/duration/timing/severity/associated sxs/prior treatment) Patient is a 57 y.o. female presenting with neck injury. The history is provided by the patient. No language interpreter was used.  Neck Injury This is a new problem. The current episode started yesterday. The problem occurs constantly. Associated symptoms include congestion. The symptoms are aggravated by twisting. She has tried nothing for the symptoms.  Pt also complains of sinus drainage, nasal congestion.   Pt reports pain with turning neck from side to side.  No fever, no vomitting no diarrhea  No cough  Past Medical History  Diagnosis Date  . Hypothyroid   . Diabetes mellitus   . Hypertension   . Asthma     Past Surgical History  Procedure Laterality Date  . Tonsillectomy    . Appendectomy    . Abdominal hysterectomy      History reviewed. No pertinent family history.  History  Substance Use Topics  . Smoking status: Former Games developer  . Smokeless tobacco: Not on file  . Alcohol Use: No    OB History   Grav Para Term Preterm Abortions TAB SAB Ect Mult Living                  Review of Systems  HENT: Positive for congestion, rhinorrhea and sinus pressure.   All other systems reviewed and are negative.    Allergies  Review of patient's allergies indicates no known allergies.  Home Medications   Current Outpatient Rx  Name  Route  Sig  Dispense  Refill  . benazepril (LOTENSIN) 40 MG tablet   Oral   Take 40 mg by mouth daily.           . chlorpheniramine-HYDROcodone (TUSSIONEX PENNKINETIC ER) 10-8 MG/5ML LQCR   Oral   Take 5 mLs by mouth every 12 (twelve) hours as needed.   50 mL   0   . gabapentin (NEURONTIN) 100 MG capsule   Oral   Take 100 mg by mouth 3 (three) times daily.            Marland Kitchen glipiZIDE (GLUCOTROL) 5 MG tablet   Oral   Take 5 mg by mouth 2 (two) times daily before a meal.           . insulin glargine (LANTUS) 100 UNIT/ML injection   Subcutaneous   Inject 80 Units into the skin at bedtime.           . insulin lispro (HUMALOG) 100 UNIT/ML injection   Subcutaneous   Inject 4 Units into the skin 3 (three) times daily before meals.           Marland Kitchen levothyroxine (SYNTHROID, LEVOTHROID) 150 MCG tablet   Oral   Take 150 mcg by mouth daily.           . pioglitazone (ACTOS) 15 MG tablet   Oral   Take 15 mg by mouth daily.           BP 153/90  Pulse 115  Temp(Src) 97.8 F (36.6 C) (Oral)  Resp 16  Ht 5' (1.524 m)  Wt 170 lb (77.111 kg)  BMI 33.2 kg/m2  SpO2 99%  Physical Exam  Constitutional: She is oriented to person, place, and time. She appears well-developed and well-nourished.  HENT:  Head: Normocephalic.  Right Ear: External ear normal.  Left Ear: External ear normal.  Nose: Nose normal.  Mouth/Throat: Oropharynx is clear and moist.  Eyes: Conjunctivae and EOM are normal. Pupils are equal, round, and reactive to light.  Tender maxillary sinuses bilat  Neck: Normal range of motion.  Pulmonary/Chest: Effort normal.  Abdominal: Soft. She exhibits no distension.  Musculoskeletal:  Tender cervial spine diffusely and right trapezius,  Decreased range of motion  Neurological: She is alert and oriented to person, place, and time.  Psychiatric: She has a normal mood and affect.    ED Course  Procedures (including critical care time)  Labs Reviewed - No data to display No results found.   1. Sinusitis   2. Torticollis       MDM   Results for orders placed during the hospital encounter of 08/08/11  GLUCOSE, CAPILLARY      Result Value Range   Glucose-Capillary 49 (*) 70 - 99 mg/dL  GLUCOSE, CAPILLARY      Result Value Range   Glucose-Capillary 130 (*) 70 - 99 mg/dL   Dg Cervical Spine Complete  03/17/2013   *RADIOLOGY REPORT*  Clinical Data: Right-sided neck pain, no injury  CERVICAL SPINE - COMPLETE 4+ VIEW  Comparison: CT of the cervical spine of 11/22/2010  Findings: The cervical vertebrae are straightened in alignment. There is degenerative disc disease at C6-7 with loss of disc space and spurring with sclerosis.  No prevertebral soft tissue swelling is seen.  On oblique views there is mild to moderate foraminal narrowing bilaterally at C6-7, with the remainder of the foramen being patent.  The odontoid process is intact.  IMPRESSION: Degenerative disc disease at C6-7 with mild to moderate foraminal narrowing bilaterally at that level.   Original Report Authenticated By: Dwyane Dee, M.D.    Pt counseled on xray results.   I will treat for torticollis,  Pt may also have sinusitis,  I advised recheck with her MD in 3-4 days        Elson Areas, PA-C 03/17/13 2331

## 2013-03-17 NOTE — ED Notes (Signed)
Pt c/o URi symptoms x 4 days 

## 2013-03-17 NOTE — ED Provider Notes (Signed)
Medical screening examination/treatment/procedure(s) were performed by non-physician practitioner and as supervising physician I was immediately available for consultation/collaboration.   Carleene Cooper III, MD 03/17/13 [phone]

## 2014-09-22 ENCOUNTER — Encounter (HOSPITAL_BASED_OUTPATIENT_CLINIC_OR_DEPARTMENT_OTHER): Payer: Self-pay | Admitting: Emergency Medicine

## 2014-09-22 ENCOUNTER — Emergency Department (HOSPITAL_BASED_OUTPATIENT_CLINIC_OR_DEPARTMENT_OTHER): Payer: Medicare Other

## 2014-09-22 ENCOUNTER — Emergency Department (HOSPITAL_BASED_OUTPATIENT_CLINIC_OR_DEPARTMENT_OTHER)
Admission: EM | Admit: 2014-09-22 | Discharge: 2014-09-22 | Disposition: A | Discharge status: Home / Self Care | Payer: Medicare Other | Attending: Emergency Medicine | Admitting: Emergency Medicine

## 2014-09-22 DIAGNOSIS — J40 Bronchitis, not specified as acute or chronic: Secondary | ICD-10-CM

## 2014-09-22 DIAGNOSIS — Z794 Long term (current) use of insulin: Secondary | ICD-10-CM | POA: Diagnosis not present

## 2014-09-22 DIAGNOSIS — R52 Pain, unspecified: Secondary | ICD-10-CM | POA: Insufficient documentation

## 2014-09-22 DIAGNOSIS — Z79899 Other long term (current) drug therapy: Secondary | ICD-10-CM | POA: Insufficient documentation

## 2014-09-22 DIAGNOSIS — I10 Essential (primary) hypertension: Secondary | ICD-10-CM | POA: Insufficient documentation

## 2014-09-22 DIAGNOSIS — Z87891 Personal history of nicotine dependence: Secondary | ICD-10-CM | POA: Diagnosis not present

## 2014-09-22 DIAGNOSIS — E119 Type 2 diabetes mellitus without complications: Secondary | ICD-10-CM | POA: Insufficient documentation

## 2014-09-22 DIAGNOSIS — E039 Hypothyroidism, unspecified: Secondary | ICD-10-CM | POA: Insufficient documentation

## 2014-09-22 DIAGNOSIS — J45901 Unspecified asthma with (acute) exacerbation: Secondary | ICD-10-CM | POA: Insufficient documentation

## 2014-09-22 DIAGNOSIS — R05 Cough: Secondary | ICD-10-CM | POA: Diagnosis present

## 2014-09-22 MED ORDER — AZITHROMYCIN 250 MG PO TABS: 250.0000 mg | ORAL_TABLET | Freq: Every day | ORAL | Status: DC

## 2014-09-22 MED ORDER — ALBUTEROL SULFATE (2.5 MG/3ML) 0.083% IN NEBU
5.0000 mg | INHALATION_SOLUTION | Freq: Once | RESPIRATORY_TRACT | Status: AC
  Administered 2014-09-22: 5 mg via RESPIRATORY_TRACT
  Filled 2014-09-22: qty 6

## 2014-09-22 MED ORDER — IPRATROPIUM BROMIDE 0.02 % IN SOLN
0.5000 mg | Freq: Once | RESPIRATORY_TRACT | Status: AC
  Administered 2014-09-22: 0.5 mg via RESPIRATORY_TRACT
  Filled 2014-09-22: qty 2.5

## 2014-09-22 MED ORDER — PREDNISONE 20 MG PO TABS: ORAL_TABLET | ORAL | Status: DC

## 2014-09-22 MED ORDER — PREDNISONE 50 MG PO TABS
60.0000 mg | ORAL_TABLET | Freq: Once | ORAL | Status: AC
  Administered 2014-09-22: 60 mg via ORAL
  Filled 2014-09-22 (×2): qty 1

## 2014-09-22 NOTE — Discharge Instructions (Signed)
Take antibiotic as directed along with prednisone. Begin prednisone tomorrow as you were given the first dose in the emergency department today. Acute Bronchitis Bronchitis is inflammation of the airways that extend from the windpipe into the lungs (bronchi). The inflammation often causes mucus to develop. This leads to a cough, which is the most common symptom of bronchitis.  In acute bronchitis, the condition usually develops suddenly and goes away over time, usually in a couple weeks. Smoking, allergies, and asthma can make bronchitis worse. Repeated episodes of bronchitis may cause further lung problems.  CAUSES Acute bronchitis is most often caused by the same virus that causes a cold. The virus can spread from person to person (contagious) through coughing, sneezing, and touching contaminated objects. SIGNS AND SYMPTOMS   Cough.   Fever.   Coughing up mucus.   Body aches.   Chest congestion.   Chills.   Shortness of breath.   Sore throat.  DIAGNOSIS  Acute bronchitis is usually diagnosed through a physical exam. Your health care provider will also ask you questions about your medical history. Tests, such as chest X-rays, are sometimes done to rule out other conditions.  TREATMENT  Acute bronchitis usually goes away in a couple weeks. Oftentimes, no medical treatment is necessary. Medicines are sometimes given for relief of fever or cough. Antibiotic medicines are usually not needed but may be prescribed in certain situations. In some cases, an inhaler may be recommended to help reduce shortness of breath and control the cough. A cool mist vaporizer may also be used to help thin bronchial secretions and make it easier to clear the chest.  HOME CARE INSTRUCTIONS  Get plenty of rest.   Drink enough fluids to keep your urine clear or pale yellow (unless you have a medical condition that requires fluid restriction). Increasing fluids may help thin your respiratory secretions  (sputum) and reduce chest congestion, and it will prevent dehydration.   Take medicines only as directed by your health care provider.  If you were prescribed an antibiotic medicine, finish it all even if you start to feel better.  Avoid smoking and secondhand smoke. Exposure to cigarette smoke or irritating chemicals will make bronchitis worse. If you are a smoker, consider using nicotine gum or skin patches to help control withdrawal symptoms. Quitting smoking will help your lungs heal faster.   Reduce the chances of another bout of acute bronchitis by washing your hands frequently, avoiding people with cold symptoms, and trying not to touch your hands to your mouth, nose, or eyes.   Keep all follow-up visits as directed by your health care provider.  SEEK MEDICAL CARE IF: Your symptoms do not improve after 1 week of treatment.  SEEK IMMEDIATE MEDICAL CARE IF:  You develop an increased fever or chills.   You have chest pain.   You have severe shortness of breath.  You have bloody sputum.   You develop dehydration.  You faint or repeatedly feel like you are going to pass out.  You develop repeated vomiting.  You develop a severe headache. MAKE SURE YOU:   Understand these instructions.  Will watch your condition.  Will get help right away if you are not doing well or get worse. Document Released: 01/04/2005 Document Revised: 04/13/2014 Document Reviewed: 05/20/2013 [facility] Patient Information 2015 [place], [place]. This information is not intended to replace advice given to you by your health care provider. Make sure you discuss any questions you have with your health care provider.

## 2014-09-22 NOTE — ED Notes (Signed)
Fever, body aches, cough with green sputum and SOB. She has a hx of asthma. Last Tylenol was last night.

## 2014-09-22 NOTE — ED Provider Notes (Signed)
CSN: [id number]     Arrival date & time 09/22/14  1221 History   First MD Initiated Contact with Patient 09/22/14 1319     Chief Complaint  Patient presents with  . Fever  . Generalized Body Aches  . Cough     (Consider location/radiation/quality/duration/timing/severity/associated sxs/prior Treatment) HPI Comments: This is a 58 year old female with a past medical history of hypothyroidism, hypertension, diabetes and asthma who presents to the emergency department complaining of cough, wheezing and generalized body aches x2 weeks. Cough is productive with green sputum. Yesterday evening she developed a fever of 103, she took Tylenol which brought her fever down. She has been taking Mucinex and naproxen with minimal relief. She's also been using her nebulizer treatment and inhaler at home. Admits to shortness of breath when she is coughing only. Denies chest pain, however states her chest feels tight. Denies nausea or vomiting.  Patient is a 58 y.o. female presenting with fever and cough. The history is provided by the patient.  Fever Associated symptoms: cough   Cough Associated symptoms: fever and wheezing     Past Medical History  Diagnosis Date  . Hypothyroid   . Diabetes mellitus   . Hypertension   . Asthma    Past Surgical History  Procedure Laterality Date  . Tonsillectomy    . Appendectomy    . Abdominal hysterectomy     No family history on file. History  Substance Use Topics  . Smoking status: Former Games developermoker  . Smokeless tobacco: Not on file  . Alcohol Use: No   OB History   Grav Para Term Preterm Abortions TAB SAB Ect Mult Living                 Review of Systems  Constitutional: Positive for fever.  Respiratory: Positive for cough, chest tightness and wheezing.   All other systems reviewed and are negative.     Allergies  Review of patient's allergies indicates no known allergies.  Home Medications   Prior to Admission medications   Medication  Sig Start Date End Date Taking? Authorizing Provider  METFORMIN HCL PO Take by mouth.   Yes Historical Provider, MD  amoxicillin (AMOXIL) 500 MG capsule Take 1 capsule (500 mg total) by mouth 3 (three) times daily. 03/17/13   Elson AreasLeslie K Sofia, PA-C  azithromycin (ZITHROMAX) 250 MG tablet Take 1 tablet (250 mg total) by mouth daily. Take first 2 tablets together, then 1 every day until finished. 09/22/14   Chiann Goffredo M Sherlene Rickel, PA-C  benazepril (LOTENSIN) 40 MG tablet Take 40 mg by mouth daily.      Historical Provider, MD  chlorpheniramine-HYDROcodone (TUSSIONEX PENNKINETIC ER) 10-8 MG/5ML LQCR Take 5 mLs by mouth every 12 (twelve) hours as needed. 01/01/12   Doug SouSam Jacubowitz, MD  diazepam (VALIUM) 5 MG tablet Take 1 tablet (5 mg total) by mouth 2 (two) times daily. 03/17/13   Elson AreasLeslie K Sofia, PA-C  gabapentin (NEURONTIN) 100 MG capsule Take 100 mg by mouth 3 (three) times daily.      Historical Provider, MD  glipiZIDE (GLUCOTROL) 5 MG tablet Take 5 mg by mouth 2 (two) times daily before a meal.      Historical Provider, MD  HYDROcodone-acetaminophen (NORCO/VICODIN) 5-325 MG per tablet Take 2 tablets by mouth every 4 (four) hours as needed for pain. 03/17/13   Elson AreasLeslie K Sofia, PA-C  insulin glargine (LANTUS) 100 UNIT/ML injection Inject 80 Units into the skin at bedtime.      Historical Provider, MD  insulin lispro (HUMALOG) 100 UNIT/ML injection Inject 4 Units into the skin 3 (three) times daily before meals.      Historical Provider, MD  levothyroxine (SYNTHROID, LEVOTHROID) 150 MCG tablet Take 150 mcg by mouth daily.      Historical Provider, MD  pioglitazone (ACTOS) 15 MG tablet Take 15 mg by mouth daily.    Historical Provider, MD  predniSONE (DELTASONE) 20 MG tablet 2 tabs po daily x 4 days 09/22/14   Nada Boozerobyn M Issis Lindseth, PA-C   BP 137/94  Pulse 81  Temp(Src) 98.2 F (36.8 C) (Oral)  Resp 20  Ht 5' (1.524 m)  Wt 160 lb (72.576 kg)  BMI 31.25 kg/m2  SpO2 99% Physical Exam  Nursing note and vitals  reviewed. Constitutional: She is oriented to person, place, and time. She appears well-developed and well-nourished. No distress.  HENT:  Head: Normocephalic and atraumatic.  Post nasal drip. Post oropharyngeal erythema, no edema or exudate.  Eyes: Conjunctivae are normal.  Neck: Normal range of motion. Neck supple.  Cardiovascular: Normal rate, regular rhythm and normal heart sounds.   Pulmonary/Chest: Effort normal. No respiratory distress.  Scattered expiratory wheezes bilateral. Poor air movement. Harsh cough present.  Musculoskeletal: Normal range of motion. She exhibits no edema.  Neurological: She is alert and oriented to person, place, and time.  Skin: Skin is warm and dry. She is not diaphoretic.  Psychiatric: She has a normal mood and affect. Her behavior is normal.    ED Course  Procedures (including critical care time) Labs Review Labs Reviewed - No data to display  Imaging Review Dg Chest 2 View  09/22/2014   CLINICAL DATA:  Cough and chest congestion. Body aches. Fever. Sweats.  EXAM: CHEST  2 VIEW  COMPARISON:  01/01/2012  FINDINGS: Heart size and pulmonary vascularity are normal and the lungs are clear. Chronic slight elevation of the right hemidiaphragm. No effusions. No osseous abnormality.  IMPRESSION: No acute disease.   Electronically Signed   By: Geanie CooleyJim  Maxwell M.D.   On: 09/22/2014 12:45     EKG Interpretation None      MDM   Final diagnoses:  Bronchitis   Patient nontoxic appearing and in no apparent distress. Afebrile, vital signs stable. O2 sat 99% on room air. Scattered wheezes noted on exam along with a harsh cough. Chest x-ray obtained prior to my evaluation, negative for any acute findings. Patient given DuoNeb with significant improvement of breath sounds. 60 mg oral prednisone started in the emergency department, will discharge him with a short course of prednisone, along with azithromycin for bronchitis as symptoms have been present for 2 weeks.  Advised her to continue her nebulizer and albuterol inhaler, followup with PCP. Stable for discharge. Return precautions given. Patient states understanding of treatment care plan and is agreeable.  Kathrynn SpeedRobyn M Canesha Tesfaye, PA-C 09/22/14 [phone]

## 2014-09-22 NOTE — ED Provider Notes (Signed)
Medical screening examination/treatment/procedure(s) were performed by non-physician practitioner and as supervising physician I was immediately available for consultation/collaboration.  Megan Docherty, MD 09/22/14 1554 

## 2014-09-28 ENCOUNTER — Encounter (HOSPITAL_BASED_OUTPATIENT_CLINIC_OR_DEPARTMENT_OTHER): Payer: Self-pay | Admitting: Emergency Medicine

## 2014-09-28 ENCOUNTER — Emergency Department (HOSPITAL_BASED_OUTPATIENT_CLINIC_OR_DEPARTMENT_OTHER)
Admission: EM | Admit: 2014-09-28 | Discharge: 2014-09-28 | Disposition: A | Discharge status: Home / Self Care | Payer: Medicare Other | Attending: Emergency Medicine | Admitting: Emergency Medicine

## 2014-09-28 DIAGNOSIS — I10 Essential (primary) hypertension: Secondary | ICD-10-CM | POA: Diagnosis not present

## 2014-09-28 DIAGNOSIS — J45909 Unspecified asthma, uncomplicated: Secondary | ICD-10-CM | POA: Diagnosis not present

## 2014-09-28 DIAGNOSIS — E039 Hypothyroidism, unspecified: Secondary | ICD-10-CM | POA: Diagnosis not present

## 2014-09-28 DIAGNOSIS — Z79899 Other long term (current) drug therapy: Secondary | ICD-10-CM | POA: Diagnosis not present

## 2014-09-28 DIAGNOSIS — E119 Type 2 diabetes mellitus without complications: Secondary | ICD-10-CM | POA: Insufficient documentation

## 2014-09-28 DIAGNOSIS — M545 Low back pain, unspecified: Secondary | ICD-10-CM

## 2014-09-28 DIAGNOSIS — Z7952 Long term (current) use of systemic steroids: Secondary | ICD-10-CM | POA: Insufficient documentation

## 2014-09-28 DIAGNOSIS — Z792 Long term (current) use of antibiotics: Secondary | ICD-10-CM | POA: Insufficient documentation

## 2014-09-28 DIAGNOSIS — Z87891 Personal history of nicotine dependence: Secondary | ICD-10-CM | POA: Diagnosis not present

## 2014-09-28 DIAGNOSIS — Z794 Long term (current) use of insulin: Secondary | ICD-10-CM | POA: Diagnosis not present

## 2014-09-28 LAB — URINE MICROSCOPIC-ADD ON

## 2014-09-28 LAB — URINALYSIS, ROUTINE W REFLEX MICROSCOPIC
Bilirubin Urine: NEGATIVE
Hgb urine dipstick: NEGATIVE
KETONES UR: NEGATIVE mg/dL
LEUKOCYTES UA: NEGATIVE
Nitrite: NEGATIVE
PROTEIN: NEGATIVE mg/dL
Specific Gravity, Urine: 1.028 (ref 1.005–1.030)
UROBILINOGEN UA: 2 mg/dL — AB (ref 0.0–1.0)
pH: 6.5 (ref 5.0–8.0)

## 2014-09-28 LAB — CBG MONITORING, ED: GLUCOSE-CAPILLARY: 289 mg/dL — AB (ref 70–99)

## 2014-09-28 MED ORDER — TRAMADOL HCL 50 MG PO TABS: 50.0000 mg | ORAL_TABLET | Freq: Four times a day (QID) | ORAL | Status: DC | PRN

## 2014-09-28 NOTE — Discharge Instructions (Signed)
Back Pain, Adult Low back pain is very common. About 1 in 5 people have back pain.The cause of low back pain is rarely dangerous. The pain often gets better over time.About half of people with a sudden onset of back pain feel better in just 2 weeks. About 8 in 10 people feel better by 6 weeks.  CAUSES Some common causes of back pain include:  Strain of the muscles or ligaments supporting the spine.  Wear and tear (degeneration) of the spinal discs.  Arthritis.  Direct injury to the back. DIAGNOSIS Most of the time, the direct cause of low back pain is not known.However, back pain can be treated effectively even when the exact cause of the pain is unknown.Answering your caregiver's questions about your overall health and symptoms is one of the most accurate ways to make sure the cause of your pain is not dangerous. If your caregiver needs more information, he or she may order lab work or imaging tests (X-rays or MRIs).However, even if imaging tests show changes in your back, this usually does not require surgery. HOME CARE INSTRUCTIONS For many people, back pain returns.Since low back pain is rarely dangerous, it is often a condition that people can learn to manageon their own.   Remain active. It is stressful on the back to sit or stand in one place. Do not sit, drive, or stand in one place for more than 30 minutes at a time. Take short walks on level surfaces as soon as pain allows.Try to increase the length of time you walk each day.  Do not stay in bed.Resting more than 1 or 2 days can delay your recovery.  Do not avoid exercise or work.Your body is made to move.It is not dangerous to be active, even though your back may hurt.Your back will likely heal faster if you return to being active before your pain is gone.  Pay attention to your body when you bend and lift. Many people have less discomfortwhen lifting if they bend their knees, keep the load close to their bodies,and  avoid twisting. Often, the most comfortable positions are those that put less stress on your recovering back.  Find a comfortable position to sleep. Use a firm mattress and lie on your side with your knees slightly bent. If you lie on your back, put a pillow under your knees.  Only take over-the-counter or prescription medicines as directed by your caregiver. Over-the-counter medicines to reduce pain and inflammation are often the most helpful.Your caregiver may prescribe muscle relaxant drugs.These medicines help dull your pain so you can more quickly return to your normal activities and healthy exercise.  Put ice on the injured area.  Put ice in a plastic bag.  Place a towel between your skin and the bag.  Leave the ice on for 15-20 minutes, 03-04 times a day for the first 2 to 3 days. After that, ice and heat may be alternated to reduce pain and spasms.  Ask your caregiver about trying back exercises and gentle massage. This may be of some benefit.  Avoid feeling anxious or stressed.Stress increases muscle tension and can worsen back pain.It is important to recognize when you are anxious or stressed and learn ways to manage it.Exercise is a great option. SEEK MEDICAL CARE IF:  You have pain that is not relieved with rest or medicine.  You have pain that does not improve in 1 week.  You have new symptoms.  You are generally not feeling well. SEEK   IMMEDIATE MEDICAL CARE IF:   You have pain that radiates from your back into your legs.  You develop new bowel or bladder control problems.  You have unusual weakness or numbness in your arms or legs.  You develop nausea or vomiting.  You develop abdominal pain.  You feel faint. Document Released: 11/27/2005 Document Revised: 05/28/2012 Document Reviewed: 03/31/2014 [employer] Patient Information 2015 [employer]. This information is not intended to replace advice given to you by your health care provider. Make sure you  discuss any questions you have with your health care provider.  

## 2014-09-28 NOTE — ED Provider Notes (Signed)
CSN: [id number]     Arrival date & time 09/28/14  1326 History   First MD Initiated Contact with Patient 09/28/14 1604     Chief Complaint  Patient presents with  . Back Pain     (Consider location/radiation/quality/duration/timing/severity/associated sxs/prior Treatment) HPI Comments: Pt comes in today with c/o left lower back pain with movement. Denies any injury. States that she is having some urinary frequency. Denies numbness, weakness or incontinence. No fever. No recent injury.   The history is provided by the patient. No language interpreter was used.    Past Medical History  Diagnosis Date  . Hypothyroid   . Diabetes mellitus   . Hypertension   . Asthma    Past Surgical History  Procedure Laterality Date  . Tonsillectomy    . Appendectomy    . Abdominal hysterectomy     No family history on file. History  Substance Use Topics  . Smoking status: Former Games developermoker  . Smokeless tobacco: Not on file  . Alcohol Use: No   OB History   Grav Para Term Preterm Abortions TAB SAB Ect Mult Living                 Review of Systems  All other systems reviewed and are negative.     Allergies  Review of patient's allergies indicates no known allergies.  Home Medications   Prior to Admission medications   Medication Sig Start Date End Date Taking? Authorizing Provider  amoxicillin (AMOXIL) 500 MG capsule Take 1 capsule (500 mg total) by mouth 3 (three) times daily. 03/17/13   Elson AreasLeslie K Sofia, PA-C  azithromycin (ZITHROMAX) 250 MG tablet Take 1 tablet (250 mg total) by mouth daily. Take first 2 tablets together, then 1 every day until finished. 09/22/14   Robyn M Hess, PA-C  benazepril (LOTENSIN) 40 MG tablet Take 40 mg by mouth daily.      Historical Provider, MD  chlorpheniramine-HYDROcodone (TUSSIONEX PENNKINETIC ER) 10-8 MG/5ML LQCR Take 5 mLs by mouth every 12 (twelve) hours as needed. 01/01/12   Doug SouSam Jacubowitz, MD  diazepam (VALIUM) 5 MG tablet Take 1 tablet (5 mg  total) by mouth 2 (two) times daily. 03/17/13   Elson AreasLeslie K Sofia, PA-C  gabapentin (NEURONTIN) 100 MG capsule Take 100 mg by mouth 3 (three) times daily.      Historical Provider, MD  glipiZIDE (GLUCOTROL) 5 MG tablet Take 5 mg by mouth 2 (two) times daily before a meal.      Historical Provider, MD  HYDROcodone-acetaminophen (NORCO/VICODIN) 5-325 MG per tablet Take 2 tablets by mouth every 4 (four) hours as needed for pain. 03/17/13   Elson AreasLeslie K Sofia, PA-C  insulin glargine (LANTUS) 100 UNIT/ML injection Inject 80 Units into the skin at bedtime.      Historical Provider, MD  insulin lispro (HUMALOG) 100 UNIT/ML injection Inject 4 Units into the skin 3 (three) times daily before meals.      Historical Provider, MD  levothyroxine (SYNTHROID, LEVOTHROID) 150 MCG tablet Take 150 mcg by mouth daily.      Historical Provider, MD  METFORMIN HCL PO Take by mouth.    Historical Provider, MD  pioglitazone (ACTOS) 15 MG tablet Take 15 mg by mouth daily.    Historical Provider, MD  predniSONE (DELTASONE) 20 MG tablet 2 tabs po daily x 4 days 09/22/14   Kathrynn Speedobyn M Hess, PA-C   BP 162/91  Pulse 81  Temp(Src) 98.8 F (37.1 C) (Oral)  Resp 18  Ht 5' (  1.524 m)  Wt 170 lb (77.111 kg)  BMI 33.20 kg/m2  SpO2 99% Physical Exam  Nursing note and vitals reviewed. Constitutional: She is oriented to person, place, and time. She appears well-developed and well-nourished.  Cardiovascular: Normal rate and regular rhythm.   Pulmonary/Chest: Effort normal and breath sounds normal.  Abdominal: Soft. Bowel sounds are normal. There is no tenderness.  Musculoskeletal:  Left lumbar paraspinal tenderness. Full rom. Equal strength in bilateral lower extremities  Neurological: She is alert and oriented to person, place, and time.  Skin: Skin is warm and dry.    ED Course  Procedures (including critical care time) Labs Review Labs Reviewed  URINALYSIS, ROUTINE W REFLEX MICROSCOPIC - Abnormal; Notable for the following:     Glucose, UA >1000 (*)    Urobilinogen, UA 2.0 (*)    All other components within normal limits  URINE MICROSCOPIC-ADD ON - Abnormal; Notable for the following:    Squamous Epithelial / LPF FEW (*)    All other components within normal limits  CBG MONITORING, ED - Abnormal; Notable for the following:    Glucose-Capillary 289 (*)    All other components within normal limits    Imaging Review No results found.   EKG Interpretation None      MDM   Final diagnoses:  Left-sided low back pain without sciatica    Will treat symptomatically with ultram for pain. Urine not showing infection. Don't think imaging is needed at his time. Pt instructed on taking diabetes medications    Teressa LowerVrinda Aurilla Coulibaly, NP 09/28/14 1654

## 2014-09-28 NOTE — ED Notes (Signed)
C/o left lower back pain x 2 days-denies injury-steady gait into triage

## 2014-09-29 NOTE — ED Provider Notes (Signed)
Medical screening examination/treatment/procedure(s) were performed by non-physician practitioner and as supervising physician I was immediately available for consultation/collaboration.   EKG Interpretation None        Ariely Riddell, MD 09/29/14 2348 

## 2016-01-18 ENCOUNTER — Emergency Department (HOSPITAL_BASED_OUTPATIENT_CLINIC_OR_DEPARTMENT_OTHER): Payer: Medicare Other

## 2016-01-18 ENCOUNTER — Encounter (HOSPITAL_BASED_OUTPATIENT_CLINIC_OR_DEPARTMENT_OTHER): Payer: Self-pay

## 2016-01-18 ENCOUNTER — Emergency Department (HOSPITAL_BASED_OUTPATIENT_CLINIC_OR_DEPARTMENT_OTHER)
Admission: EM | Admit: 2016-01-18 | Discharge: 2016-01-19 | Disposition: A | Discharge status: Home / Self Care | Payer: Medicare Other | Attending: Emergency Medicine | Admitting: Emergency Medicine

## 2016-01-18 DIAGNOSIS — Z79899 Other long term (current) drug therapy: Secondary | ICD-10-CM | POA: Insufficient documentation

## 2016-01-18 DIAGNOSIS — R52 Pain, unspecified: Secondary | ICD-10-CM | POA: Diagnosis present

## 2016-01-18 DIAGNOSIS — Z87891 Personal history of nicotine dependence: Secondary | ICD-10-CM | POA: Insufficient documentation

## 2016-01-18 DIAGNOSIS — J45901 Unspecified asthma with (acute) exacerbation: Secondary | ICD-10-CM | POA: Diagnosis not present

## 2016-01-18 DIAGNOSIS — Z7984 Long term (current) use of oral hypoglycemic drugs: Secondary | ICD-10-CM | POA: Insufficient documentation

## 2016-01-18 DIAGNOSIS — E119 Type 2 diabetes mellitus without complications: Secondary | ICD-10-CM | POA: Insufficient documentation

## 2016-01-18 DIAGNOSIS — R110 Nausea: Secondary | ICD-10-CM | POA: Insufficient documentation

## 2016-01-18 DIAGNOSIS — I10 Essential (primary) hypertension: Secondary | ICD-10-CM | POA: Insufficient documentation

## 2016-01-18 DIAGNOSIS — M791 Myalgia: Secondary | ICD-10-CM | POA: Diagnosis not present

## 2016-01-18 DIAGNOSIS — Z794 Long term (current) use of insulin: Secondary | ICD-10-CM | POA: Insufficient documentation

## 2016-01-18 DIAGNOSIS — J209 Acute bronchitis, unspecified: Secondary | ICD-10-CM

## 2016-01-18 DIAGNOSIS — E039 Hypothyroidism, unspecified: Secondary | ICD-10-CM | POA: Insufficient documentation

## 2016-01-18 DIAGNOSIS — R61 Generalized hyperhidrosis: Secondary | ICD-10-CM | POA: Insufficient documentation

## 2016-01-18 LAB — BASIC METABOLIC PANEL
Anion gap: 7 (ref 5–15)
BUN: 11 mg/dL (ref 6–20)
CALCIUM: 9.4 mg/dL (ref 8.9–10.3)
CHLORIDE: 101 mmol/L (ref 101–111)
CO2: 27 mmol/L (ref 22–32)
CREATININE: 0.59 mg/dL (ref 0.44–1.00)
GFR calc non Af Amer: >60 mL/min (ref >60)
Glucose, Bld: 288 mg/dL — ABNORMAL HIGH (ref 65–99)
Potassium: 3.8 mmol/L (ref 3.5–5.1)
SODIUM: 135 mmol/L (ref 135–145)

## 2016-01-18 LAB — CBC
HCT: 39.6 % (ref 36.0–46.0)
Hemoglobin: 13.3 g/dL (ref 12.0–15.0)
MCH: 29.4 pg (ref 26.0–34.0)
MCHC: 33.6 g/dL (ref 30.0–36.0)
MCV: 87.6 fL (ref 78.0–100.0)
PLATELETS: 157 10*3/uL (ref 150–400)
RBC: 4.52 MIL/uL (ref 3.87–5.11)
RDW: 12.9 % (ref 11.5–15.5)
WBC: 5.2 10*3/uL (ref 4.0–10.5)

## 2016-01-18 LAB — TROPONIN I

## 2016-01-18 MED ORDER — AMOXICILLIN 500 MG PO CAPS
1000.0000 mg | ORAL_CAPSULE | Freq: Once | ORAL | Status: AC
  Administered 2016-01-18: 1000 mg via ORAL
  Filled 2016-01-18: qty 2

## 2016-01-18 MED ORDER — PREDNISONE 50 MG PO TABS
60.0000 mg | ORAL_TABLET | Freq: Once | ORAL | Status: AC
  Administered 2016-01-18: 60 mg via ORAL
  Filled 2016-01-18: qty 1

## 2016-01-18 MED ORDER — HYDROCODONE-ACETAMINOPHEN 5-325 MG PO TABS
1.0000 | ORAL_TABLET | Freq: Once | ORAL | Status: AC
  Administered 2016-01-18: 1 via ORAL
  Filled 2016-01-18: qty 1

## 2016-01-18 MED ORDER — IPRATROPIUM-ALBUTEROL 0.5-2.5 (3) MG/3ML IN SOLN
3.0000 mL | Freq: Once | RESPIRATORY_TRACT | Status: AC
  Administered 2016-01-18: 3 mL via RESPIRATORY_TRACT
  Filled 2016-01-18: qty 3

## 2016-01-18 NOTE — ED Provider Notes (Signed)
CSN: [id number]     Arrival date & time 01/18/16  1825 History   By signing my name below, I, Arlan Organ, attest that this documentation has been prepared under the direction and in the presence of Dione Booze, MD.  Electronically Signed: Arlan Organ, ED Scribe. 01/18/2016. 11:29 PM.   Chief Complaint  Patient presents with  . Generalized Body Aches   The history is provided by the patient. No language interpreter was used.    HPI Comments: Oaklie Durrett is a 60 y.o. female with a PMHx of HTN, asthma, hypothyroid, and DM who presents to the Emergency Department complaining of constant, ongoing L sided chest soreness x 2 days. Currently pain is rated 8/10. She also reports myalgias, sore throat, chills, nausea, productive cough, and low grade fever of 99.0. Pt states she is unable to keep any fluids or solid foods down at this time. No aggravating or alleviating factors at this time. At home breathing treatment and Mucinex attempted at home without any improvement. She denies any recent abdominal pain or diarrhea.  PCP: [facility] [place]  Past Medical History  Diagnosis Date  . Hypothyroid   . Diabetes mellitus   . Hypertension   . Asthma    Past Surgical History  Procedure Laterality Date  . Tonsillectomy    . Appendectomy    . Abdominal hysterectomy     No family history on file. Social History  Substance Use Topics  . Smoking status: Former Games developer  . Smokeless tobacco: None  . Alcohol Use: No   OB History    No data available     Review of Systems  Constitutional: Positive for fever, chills, diaphoresis, activity change and appetite change.  HENT: Positive for congestion.   Respiratory: Positive for cough and shortness of breath.   Cardiovascular: Positive for chest pain.  Gastrointestinal: Positive for nausea. Negative for vomiting, abdominal pain and diarrhea.  Musculoskeletal: Positive for myalgias.  Neurological: Negative for headaches.   Psychiatric/Behavioral: Negative for confusion.  All other systems reviewed and are negative.     Allergies  Review of patient's allergies indicates no known allergies.  Home Medications   Prior to Admission medications   Medication Sig Start Date End Date Taking? Authorizing Provider  Albuterol (PROVENTIL IN) Inhale into the lungs.   Yes Historical Provider, MD  Montelukast Sodium (SINGULAIR PO) Take by mouth.   Yes Historical Provider, MD  benazepril (LOTENSIN) 40 MG tablet Take 40 mg by mouth daily.      Historical Provider, MD  gabapentin (NEURONTIN) 100 MG capsule Take 100 mg by mouth 3 (three) times daily.      Historical Provider, MD  glipiZIDE (GLUCOTROL) 5 MG tablet Take 5 mg by mouth 2 (two) times daily before a meal.      Historical Provider, MD  insulin glargine (LANTUS) 100 UNIT/ML injection Inject 80 Units into the skin at bedtime.      Historical Provider, MD  insulin lispro (HUMALOG) 100 UNIT/ML injection Inject 4 Units into the skin 3 (three) times daily before meals.      Historical Provider, MD  levothyroxine (SYNTHROID, LEVOTHROID) 150 MCG tablet Take 150 mcg by mouth daily.      Historical Provider, MD  METFORMIN HCL PO Take by mouth.    Historical Provider, MD   Triage Vitals: BP 150/96 mmHg  Pulse 78  Temp(Src) 98.3 F (36.8 C) (Oral)  Resp 20  Ht 5' (1.524 m)  SpO2 100%   Physical  Exam  Constitutional: She is oriented to person, place, and time. She appears well-developed and well-nourished. No distress.  HENT:  Head: Normocephalic and atraumatic.  Eyes: EOM are normal. Pupils are equal, round, and reactive to light.  Neck: Normal range of motion. Neck supple. No JVD present.  Cardiovascular: Normal rate, regular rhythm and normal heart sounds.   No murmur heard. Pulmonary/Chest: Effort normal. She has wheezes. She has no rales. She exhibits tenderness.  Slight wheeze with forced exhalation Moderate tenderness to L anterior chest wall   Abdominal:  Soft. Bowel sounds are normal. She exhibits no distension and no mass. There is no tenderness.  Musculoskeletal: Normal range of motion. She exhibits no edema.  Lymphadenopathy:    She has no cervical adenopathy.  Neurological: She is alert and oriented to person, place, and time. No cranial nerve deficit. She exhibits normal muscle tone. Coordination normal.  Skin: Skin is warm and dry. No rash noted.  Psychiatric: She has a normal mood and affect. Her behavior is normal. Judgment and thought content normal.  Nursing note and vitals reviewed.   ED Course  Procedures (including critical care time)  DIAGNOSTIC STUDIES: Oxygen Saturation is 100% on RA, Normal  by my interpretation.    COORDINATION OF CARE: 11:26 PM- Will order EKG, Troponin I, CBC, BMP, and CXR. Will give breathing treatment, Norco. Amoxil, and Prednisone. Discussed treatment plan with pt at bedside and pt agreed to plan.     Labs Review Results for orders placed or performed during the hospital encounter of 01/18/16  Basic metabolic panel  Result Value Ref Range   Sodium 135 135 - 145 mmol/L   Potassium 3.8 3.5 - 5.1 mmol/L   Chloride 101 101 - 111 mmol/L   CO2 27 22 - 32 mmol/L   Glucose, Bld 288 (H) 65 - 99 mg/dL   BUN 11 6 - 20 mg/dL   Creatinine, Ser 1.61 0.44 - 1.00 mg/dL   Calcium 9.4 8.9 - 09.6 mg/dL   GFR calc non Af Amer >60 >60 mL/min   GFR calc Af Amer >60 >60 mL/min   Anion gap 7 5 - 15  CBC  Result Value Ref Range   WBC 5.2 4.0 - 10.5 K/uL   RBC 4.52 3.87 - 5.11 MIL/uL   Hemoglobin 13.3 12.0 - 15.0 g/dL   HCT 04.5 40.9 - 81.1 %   MCV 87.6 78.0 - 100.0 fL   MCH 29.4 26.0 - 34.0 pg   MCHC 33.6 30.0 - 36.0 g/dL   RDW 91.4 78.2 - 95.6 %   Platelets 157 150 - 400 K/uL  Troponin I  Result Value Ref Range   Troponin I <0.03 <0.031 ng/mL    Imaging Review Dg Chest 2 View  01/18/2016  CLINICAL DATA:  Generalized body aches, fever and cough for 2 weeks. EXAM: CHEST  2 VIEW COMPARISON:  09/22/2014  FINDINGS: The heart size and mediastinal contours are within normal limits. Both lungs are clear. The visualized skeletal structures are unremarkable. Chronic RIGHT hemidiaphragm elevation or eventration is stable. IMPRESSION: No active cardiopulmonary disease. Electronically Signed   By: Elsie Stain M.D.   On: 01/18/2016 22:21   I have personally reviewed and evaluated these images and lab results as part of my medical decision-making.   EKG Interpretation   Date/Time:  Tuesday January 18 2016 18:52:50 EST Ventricular Rate:  106 PR Interval:  182 QRS Duration: 84 QT Interval:  352 QTC Calculation: 467 R Axis:   44  Text Interpretation:  Sinus tachycardia Otherwise normal ECG When compared  with ECG of 11/22/2010, HEART RATE has increased Confirmed by [facility]  MD,  Estevan Kersh ([id number]) on 01/18/2016 11:19:29 PM      MDM   Final diagnoses:  Acute bronchitis, unspecified organism    Respiratory tract infection with bronchitis, rule out pneumonia. Chest x-ray shows no evidence of pneumonia. Laboratory workup is significant for blood sugar of 288. SHE reviewed and she has 2 prior ED visits for upper respiratory infection. She is given albuterol with ipratropium via nebulizer with significant improvement in her cough. Because sputum production does seem to be purulent, was decided to place her on antibiotics. She is discharged with prescription for prednisone, amoxicillin, albuterol inhaler, and hydrocodone-acetaminophen to use to suppress cough. Follow-up with PCP.  I personally performed the services described in this documentation, which was scribed in my presence. The recorded information has been reviewed and is accurate.      Dione Booze, MD 01/19/16 [phone]

## 2016-01-18 NOTE — ED Notes (Signed)
C/o "hurting all over", fever, sweats, prod cough-pain to both arms and chest x 2 days

## 2016-01-19 NOTE — ED Notes (Signed)
See paper down time charting

## 2016-04-15 IMAGING — CR DG CHEST 2V
2 series · 2 of 2 positions shown · non-contrast
Comparison: 09/22/2014

CLINICAL DATA: Generalized body aches, fever and cough for 2 weeks.

EXAM:
CHEST  2 VIEW

[w chest pa]
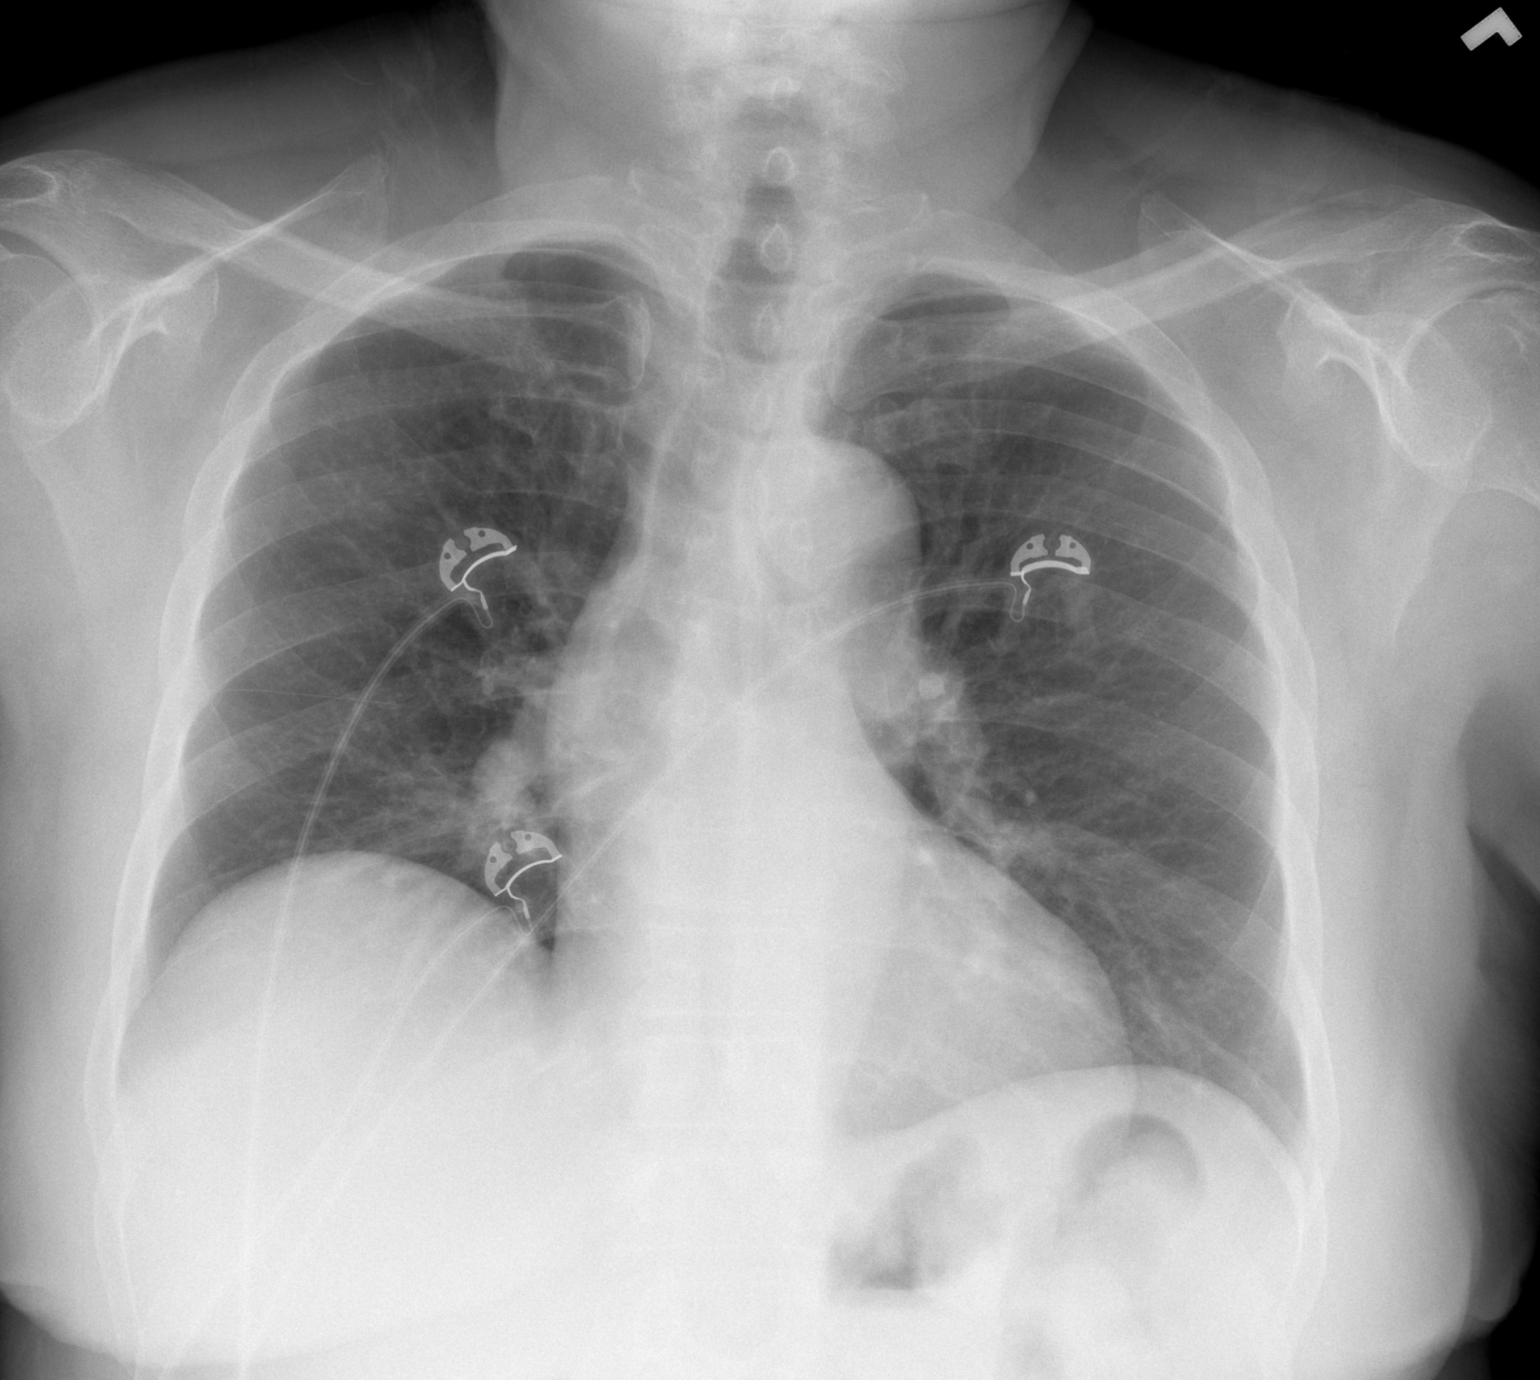

[w chest lat]
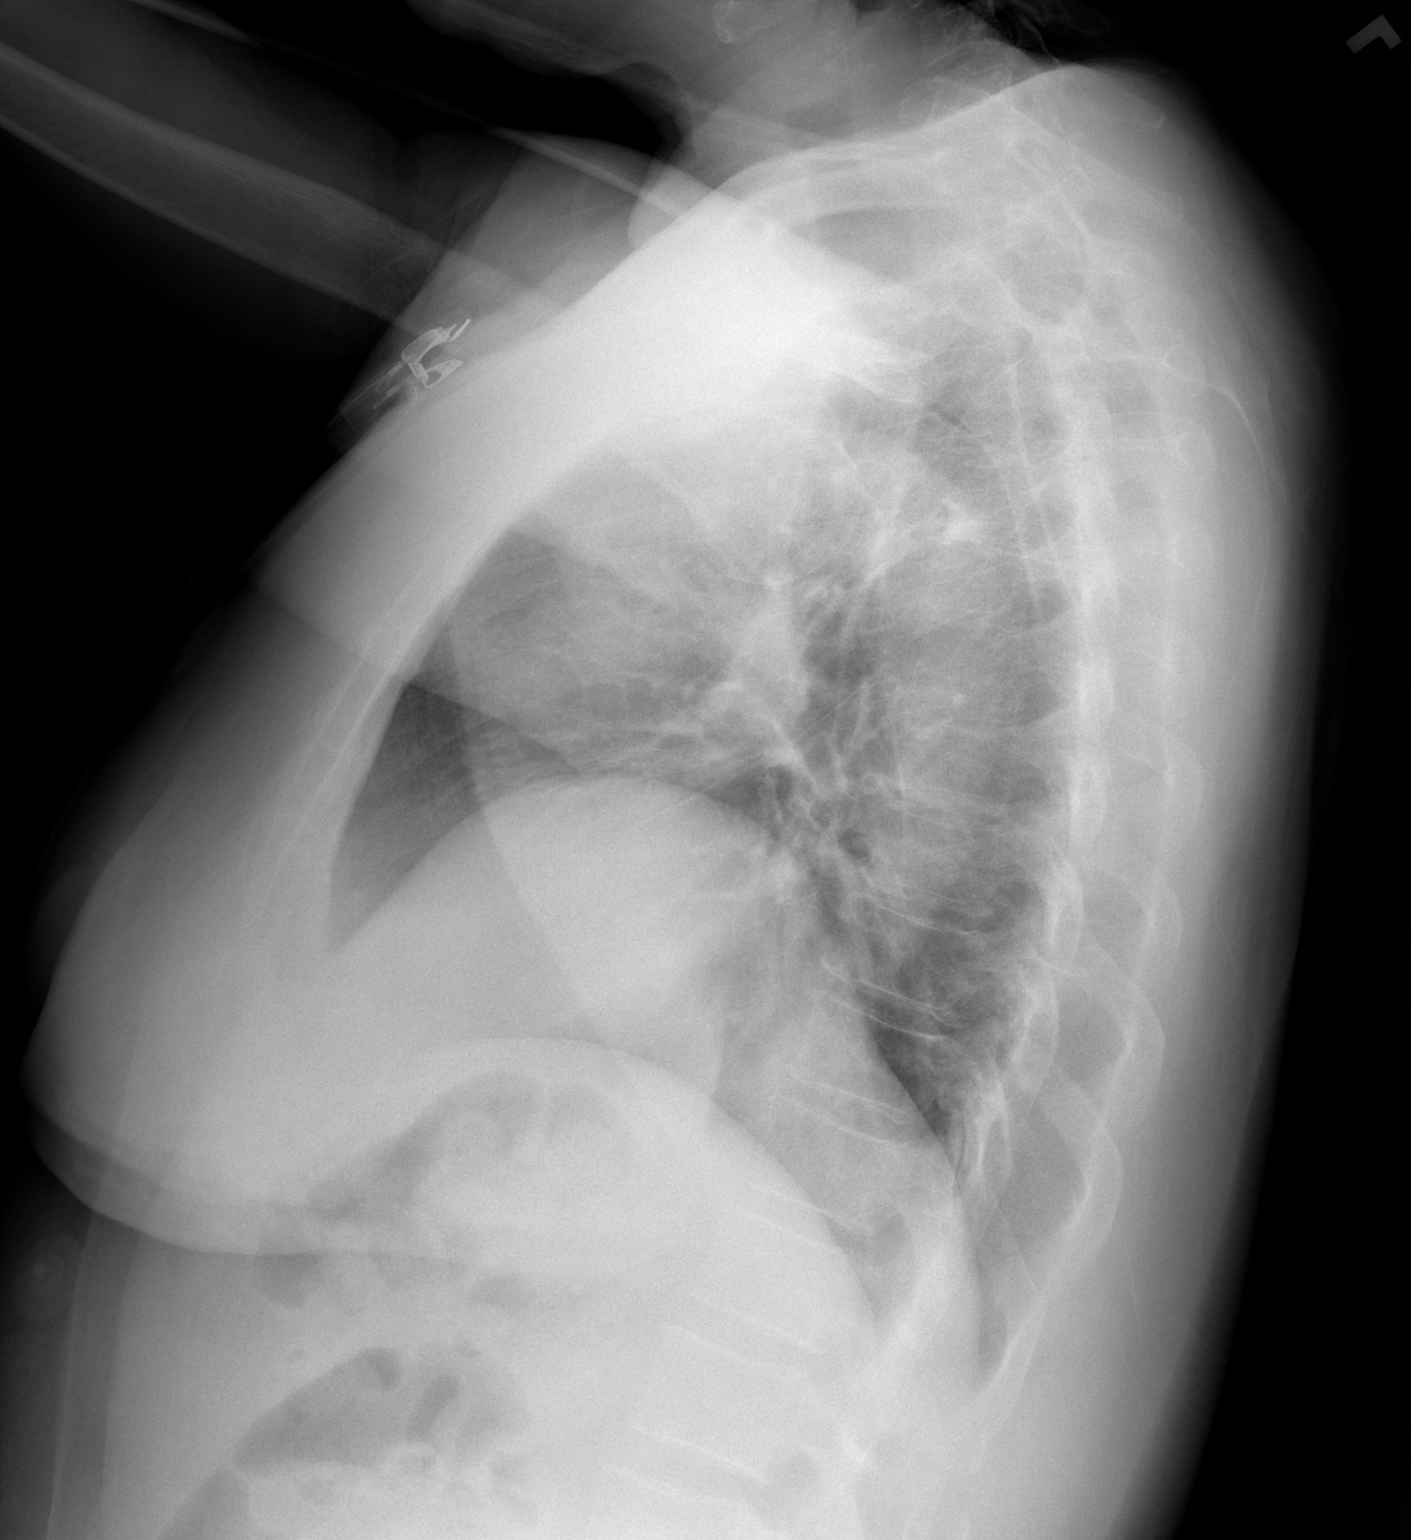

[2 of 2 positions shown; findings below may reference images not displayed]

FINDINGS: The heart size and mediastinal contours are within normal limits.
Both lungs are clear. The visualized skeletal structures are
unremarkable. Chronic RIGHT hemidiaphragm elevation or eventration
is stable.
IMPRESSION: No active cardiopulmonary disease.

## 2018-05-02 ENCOUNTER — Encounter (HOSPITAL_BASED_OUTPATIENT_CLINIC_OR_DEPARTMENT_OTHER): Payer: Self-pay

## 2018-05-02 ENCOUNTER — Other Ambulatory Visit: Payer: Self-pay

## 2018-05-02 ENCOUNTER — Other Ambulatory Visit: Payer: Self-pay | Admitting: Emergency Medicine

## 2018-05-02 ENCOUNTER — Emergency Department (HOSPITAL_BASED_OUTPATIENT_CLINIC_OR_DEPARTMENT_OTHER)
Admission: EM | Admit: 2018-05-02 | Discharge: 2018-05-02 | Disposition: A | Discharge status: Home / Self Care | Payer: Medicare Other | Attending: Emergency Medicine | Admitting: Emergency Medicine

## 2018-05-02 ENCOUNTER — Emergency Department (HOSPITAL_BASED_OUTPATIENT_CLINIC_OR_DEPARTMENT_OTHER): Payer: Medicare Other

## 2018-05-02 DIAGNOSIS — E119 Type 2 diabetes mellitus without complications: Secondary | ICD-10-CM | POA: Diagnosis not present

## 2018-05-02 DIAGNOSIS — K2950 Unspecified chronic gastritis without bleeding: Secondary | ICD-10-CM

## 2018-05-02 DIAGNOSIS — Z79899 Other long term (current) drug therapy: Secondary | ICD-10-CM | POA: Diagnosis not present

## 2018-05-02 DIAGNOSIS — J45909 Unspecified asthma, uncomplicated: Secondary | ICD-10-CM | POA: Diagnosis not present

## 2018-05-02 DIAGNOSIS — I10 Essential (primary) hypertension: Secondary | ICD-10-CM | POA: Insufficient documentation

## 2018-05-02 DIAGNOSIS — D696 Thrombocytopenia, unspecified: Secondary | ICD-10-CM | POA: Insufficient documentation

## 2018-05-02 DIAGNOSIS — R112 Nausea with vomiting, unspecified: Secondary | ICD-10-CM | POA: Diagnosis not present

## 2018-05-02 DIAGNOSIS — B9681 Helicobacter pylori [H. pylori] as the cause of diseases classified elsewhere: Secondary | ICD-10-CM | POA: Insufficient documentation

## 2018-05-02 DIAGNOSIS — Z794 Long term (current) use of insulin: Secondary | ICD-10-CM | POA: Insufficient documentation

## 2018-05-02 DIAGNOSIS — E039 Hypothyroidism, unspecified: Secondary | ICD-10-CM | POA: Diagnosis not present

## 2018-05-02 DIAGNOSIS — Z87891 Personal history of nicotine dependence: Secondary | ICD-10-CM | POA: Diagnosis not present

## 2018-05-02 DIAGNOSIS — R1010 Upper abdominal pain, unspecified: Secondary | ICD-10-CM

## 2018-05-02 DIAGNOSIS — A048 Other specified bacterial intestinal infections: Secondary | ICD-10-CM

## 2018-05-02 LAB — COMPREHENSIVE METABOLIC PANEL
ALBUMIN: 4.2 g/dL (ref 3.5–5.0)
ALK PHOS: 75 U/L (ref 38–126)
ALT: 13 U/L — ABNORMAL LOW (ref 14–54)
ANION GAP: 10 (ref 5–15)
AST: 13 U/L — ABNORMAL LOW (ref 15–41)
BILIRUBIN TOTAL: 0.5 mg/dL (ref 0.3–1.2)
BUN: 14 mg/dL (ref 6–20)
CALCIUM: 9.1 mg/dL (ref 8.9–10.3)
CO2: 25 mmol/L (ref 22–32)
CREATININE: 0.66 mg/dL (ref 0.44–1.00)
Chloride: 104 mmol/L (ref 101–111)
GLUCOSE: 244 mg/dL — AB (ref 65–99)
Potassium: 4.1 mmol/L (ref 3.5–5.1)
Sodium: 139 mmol/L (ref 135–145)
TOTAL PROTEIN: 6.8 g/dL (ref 6.5–8.1)

## 2018-05-02 LAB — URINALYSIS, ROUTINE W REFLEX MICROSCOPIC
BILIRUBIN URINE: NEGATIVE
Glucose, UA: >=500 mg/dL — AB
HGB URINE DIPSTICK: NEGATIVE
KETONES UR: NEGATIVE mg/dL
Leukocytes, UA: NEGATIVE
NITRITE: NEGATIVE
Protein, ur: NEGATIVE mg/dL
Specific Gravity, Urine: 1.025 (ref 1.005–1.030)
pH: 5.5 (ref 5.0–8.0)

## 2018-05-02 LAB — CBC WITH DIFFERENTIAL/PLATELET
Basophils Absolute: 0 10*3/uL (ref 0.0–0.1)
Basophils Relative: 1 %
Eosinophils Absolute: 0.1 10*3/uL (ref 0.0–0.7)
Eosinophils Relative: 1 %
HEMATOCRIT: 40.8 % (ref 36.0–46.0)
Hemoglobin: 14.2 g/dL (ref 12.0–15.0)
LYMPHS ABS: 1.2 10*3/uL (ref 0.7–4.0)
LYMPHS PCT: 27 %
MCH: 30.3 pg (ref 26.0–34.0)
MCHC: 34.8 g/dL (ref 30.0–36.0)
MCV: 87 fL (ref 78.0–100.0)
MONO ABS: 0.7 10*3/uL (ref 0.1–1.0)
MONOS PCT: 15 %
NEUTROS PCT: 56 %
Neutro Abs: 2.4 10*3/uL (ref 1.7–7.7)
Platelets: 136 10*3/uL — ABNORMAL LOW (ref 150–400)
RBC: 4.69 MIL/uL (ref 3.87–5.11)
RDW: 13.3 % (ref 11.5–15.5)
WBC: 4.3 10*3/uL (ref 4.0–10.5)

## 2018-05-02 LAB — URINALYSIS, MICROSCOPIC (REFLEX)

## 2018-05-02 LAB — LIPASE, BLOOD: Lipase: 30 U/L (ref 11–51)

## 2018-05-02 MED ORDER — SODIUM CHLORIDE 0.9 % IV BOLUS
1000.0000 mL | Freq: Once | INTRAVENOUS | Status: AC
  Administered 2018-05-02: 1000 mL via INTRAVENOUS

## 2018-05-02 MED ORDER — FAMOTIDINE IN NACL 20-0.9 MG/50ML-% IV SOLN
20.0000 mg | Freq: Once | INTRAVENOUS | Status: AC
  Administered 2018-05-02: 20 mg via INTRAVENOUS
  Filled 2018-05-02: qty 50

## 2018-05-02 MED ORDER — ONDANSETRON HCL 4 MG/2ML IJ SOLN
4.0000 mg | Freq: Once | INTRAMUSCULAR | Status: AC
  Administered 2018-05-02: 4 mg via INTRAVENOUS
  Filled 2018-05-02: qty 2

## 2018-05-02 MED ORDER — GI COCKTAIL ~~LOC~~
30.0000 mL | Freq: Once | ORAL | Status: AC
  Administered 2018-05-02: 30 mL via ORAL
  Filled 2018-05-02: qty 30

## 2018-05-02 MED ORDER — ONDANSETRON 4 MG PO TBDP: 4.0000 mg | ORAL_TABLET | Freq: Three times a day (TID) | ORAL | 0 refills | Status: AC | PRN

## 2018-05-02 NOTE — ED Provider Notes (Signed)
[facility] EMERGENCY DEPARTMENT Provider Note   CSN: [id number] Arrival date & time: 05/02/18  1125     History   Chief Complaint Chief Complaint  Patient presents with  . Abdominal Pain    HPI Catherine Bowman is a 62 y.o. female with a PMHx of hypothyroidism, asthma, DM2, and HTN, with a PSHx of abd hysterectomy and appendectomy, who presents to the ED with complaints of upper abd pain x1 month.  Chart review reveals that she had an endoscopy done on 03/07/18 at [facility], biopsy showed chronic gastritis with H.pylori organisms; she had Amoxicillin  BID+Clarithromycin  BID+Lansoprazole  BID x14d called in on 03/12/18 and reportedly started taking it. She states she finished this course of medications and still had symptoms, so she went back to her GI dr on 04/25/18 and had H.pylori breath test which was positive, so on 04/30/18 she was rx'd omeprazole  BID+levaquin  BID+amoxicillin  BID x14 days called in but she has not yet started taking these.  Also, she was prescribed ranitidine but hasn't taken that either.  She states that she continues to have upper abdominal pain so she came in for repeat evaluation.  She describes the pain as 8/10 intermittent stabbing nonradiating upper abdominal pain that worsens with eating, and with no treatments tried prior to arrival aside from those medications that were prescribed to her.  She reports associated nausea and vomiting with eating, states that on average she has about 2 episodes of nonbloody nonbilious emesis per day.  She also mentions that for the last week she has had some dysuria, urinary frequency, and slightly malodorous urine.  Lastly she states that last night she had a fever of 101.1 but has not had any ongoing fevers since then.  She admits to taking NSAIDs occasionally.  She drinks coffee often.     She denies CP, SOB, diarrhea/constipation, obstipation, melena, hematochezia, hematemesis, hematuria, vaginal  bleeding/discharge, myalgias, arthralgias, numbness, tingling, focal weakness, or any other complaints at this time. Denies recent travel, sick contacts, suspicious food intake, or EtOH use.   The history is provided by the patient and medical records. No language interpreter was used.  Abdominal Pain   Associated symptoms include fever, nausea, vomiting, dysuria and frequency. Pertinent negatives include diarrhea, constipation, hematuria, arthralgias and myalgias.    Past Medical History:  Diagnosis Date  . Asthma   . Diabetes mellitus   . Hypertension   . Hypothyroid     There are no active problems to display for this patient.   Past Surgical History:  Procedure Laterality Date  . ABDOMINAL HYSTERECTOMY    . APPENDECTOMY    . TONSILLECTOMY       OB History   None      Home Medications    Prior to Admission medications   Medication Sig Start Date End Date Taking? Authorizing Provider  Albuterol (PROVENTIL IN) Inhale into the lungs.    [provider]  benazepril (LOTENSIN) 40 MG tablet Take 40 mg by mouth daily.      [provider]  gabapentin (NEURONTIN) 100 MG capsule Take 100 mg by mouth 3 (three) times daily.      [provider]  glipiZIDE (GLUCOTROL) 5 MG tablet Take 5 mg by mouth 2 (two) times daily before a meal.      [provider]  insulin glargine (LANTUS) 100 UNIT/ML injection Inject 80 Units into the skin at bedtime.      [provider]  insulin lispro (HUMALOG) 100 UNIT/ML injection Inject 4 Units into the skin 3 (three) times daily before meals.      [provider]  levothyroxine (SYNTHROID, LEVOTHROID) 150 MCG tablet Take 150 mcg by mouth daily.      [provider]  METFORMIN HCL PO Take by mouth.    [provider]  Montelukast Sodium (SINGULAIR PO) Take by mouth.    [provider]    Family History No family history on file.  Social History Social History    Tobacco Use  . Smoking status: Former Games developer  . Smokeless tobacco: Never Used  Substance Use Topics  . Alcohol use: No  . Drug use: No     Allergies   Patient has no known allergies.   Review of Systems Review of Systems  Constitutional: Positive for fever. Negative for chills.  Respiratory: Negative for shortness of breath.   Cardiovascular: Negative for chest pain.  Gastrointestinal: Positive for abdominal pain, nausea and vomiting. Negative for blood in stool, constipation and diarrhea.  Genitourinary: Positive for dysuria and frequency. Negative for hematuria, vaginal bleeding and vaginal discharge.       +slightly malodorous urine  Musculoskeletal: Negative for arthralgias and myalgias.  Skin: Negative for color change.  Allergic/Immunologic: Positive for immunocompromised state (DM2).  Neurological: Negative for weakness and numbness.  Psychiatric/Behavioral: Negative for confusion.   All other systems reviewed and are negative for acute change except as noted in the HPI.    Physical Exam Updated Vital Signs BP (!) 156/88 (BP Location: Right Arm)   Pulse 63   Temp 98.3 F (36.8 C) (Oral)   Resp 18   Ht 5' (1.524 m)   Wt 77.1 kg (170 lb)   SpO2 100%   BMI 33.20 kg/m    Physical Exam  Constitutional: She is oriented to person, place, and time. Vital signs are normal. She appears well-developed and well-nourished.  Non-toxic appearance. No distress.  Afebrile, nontoxic, NAD  HENT:  Head: Normocephalic and atraumatic.  Mouth/Throat: Oropharynx is clear and moist and mucous membranes are normal.  Eyes: Conjunctivae and EOM are normal. Right eye exhibits no discharge. Left eye exhibits no discharge.  Neck: Normal range of motion. Neck supple.  Cardiovascular: Normal rate, regular rhythm, normal heart sounds and intact distal pulses. Exam reveals no gallop and no friction rub.  No murmur heard. Pulmonary/Chest: Effort normal and breath sounds normal. No  respiratory distress. She has no decreased breath sounds. She has no wheezes. She has no rhonchi. She has no rales.  Abdominal: Soft. Normal appearance and bowel sounds are normal. She exhibits no distension. There is tenderness in the right upper quadrant, epigastric area and left upper quadrant. There is no rigidity, no rebound, no guarding, no CVA tenderness, no tenderness at McBurney's point and negative Murphy's sign.  Soft, nondistended, +BS throughout, with diffuse upper abd TTP, no r/g/r, neg murphy's although pain elicited with palpation of RUQ, neg mcburney's, no CVA TTP. No lower abd TTP.   Musculoskeletal: Normal range of motion.  Neurological: She is alert and oriented to person, place, and time. She has normal strength. No sensory deficit.  Skin: Skin is warm, dry and intact. No rash noted.  Psychiatric: She has a normal mood and affect.  Nursing note and vitals reviewed.    ED Treatments / Results  Labs (all labs ordered are listed, but only abnormal results are displayed) Labs Reviewed  CBC WITH DIFFERENTIAL/PLATELET - Abnormal; Notable for the following components:  Result Value   Platelets 136 (*)    All other components within normal limits  COMPREHENSIVE METABOLIC PANEL - Abnormal; Notable for the following components:   Glucose, Bld 244 (*)    AST 13 (*)    ALT 13 (*)    All other components within normal limits  URINALYSIS, ROUTINE W REFLEX MICROSCOPIC - Abnormal; Notable for the following components:   Glucose, UA >=500 (*)    All other components within normal limits  LIPASE, BLOOD    H. Pylori Breath Test  [facility]: Component Name Value Ref Range  H. pylori C Urea Breath Test Positive (A)  Comment: Result indicates the presence of active Helicobacter pylori  infection.  Test Performed by: [facility] [address] [place], [place], [place] [address] Negative   Specimen Collected on   Breath - Mouth 04/25/2018 8:01 AM    03/07/18 Endoscopy biopsy: STOMACH, BIOPSY: Chronic active gastritis with H. pylori organisms.    EKG None  Radiology US Abdomen Complete  Result Date: 05/02/2018 CLINICAL DATA:  Upper abdominal pain for 3 weeks. EXAM: ABDOMEN ULTRASOUND COMPLETE COMPARISON:  Abdominal CT 10/01/2014 and CT from 05/28/2010 and abdominal ultrasound from 12/29/2017 FINDINGS: Gallbladder: No gallstones or wall thickening visualized. No sonographic Murphy sign noted by sonographer. Common bile duct: Diameter: 0.4 cm. Liver: There are at least 3 hyperechoic structures/lesions scattered throughout the liver. The largest measures 2.7 x 2.0 x 2.6 cm. The other lesions measure less than 2 cm. No intrahepatic biliary dilatation. Main portal vein is patent with normal direction of flow towards the liver. IVC: No abnormality visualized. Pancreas: Visualized portion unremarkable. Distal pancreatic body and pancreatic tail are not visualized due to bowel gas. Spleen: Size and appearance within normal limits. Right Kidney: Length: 11.2 cm. Echogenicity within normal limits. No mass or hydronephrosis visualized. Left Kidney: Length: 12.3 cm. Echogenicity within normal limits. No mass or hydronephrosis visualized. Abdominal aorta: No aneurysm visualized. Other findings: None. IMPRESSION: Several hyperechoic lesions scattered throughout the liver, largest measuring 2.7 cm. At least 1 of these lesions was present on the ultrasound from 2009. Suspect that these hyperechoic lesions represent cavernous hemangiomas but this could be more definitively characterized with liver MRI, with and without contrast. No evidence for gallstones or biliary dilatation. Electronically Signed   By: Richarda Overlie M.D.   On: 05/02/2018 17:26       Procedures Procedures (including critical care time)  Medications Ordered in ED Medications  ondansetron (ZOFRAN) injection 4 mg (4 mg Intravenous Given 05/02/18 1522)   famotidine (PEPCID) IVPB 20 mg premix (0 mg Intravenous Stopped 05/02/18 1815)  gi cocktail (Maalox,Lidocaine,Donnatal) (30 mLs Oral Given 05/02/18 1522)  sodium chloride 0.9 % bolus 1,000 mL (0 mLs Intravenous Stopped 05/02/18 1815)     Initial Impression / Assessment and Plan / ED Course  I have reviewed the triage vital signs and the nursing notes.  Pertinent labs & imaging results that were available during my care of the patient were reviewed by me and considered in my medical decision making (see chart for details).     62 y.o. female here with upper abd pain x1 month, had endoscopy 03/07/18 which showed chronic gastritis and h.pylori; was treated with amox/clarithro/lansoprazole combo x14d but still had symptoms so re-checked H.pylori breath test on 04/25/18 which was positive so her GI dr called in amox/omeprazole/levaquin rx's on 04/30/18 but she hasn't filled them. She also reports 1wk of UTI symptoms. On exam,  mild upper abd TTP across entire upper abdomen, nonperitoneal, neg murphy's although palpation of this area elicits pain, no lower abd TTP. Work up thus far reveals: CBC w/diff with chronic thrombocytopenia but otherwise WNL; CMP with gluc 244 but otherwise WNL; lipase WNL; U/A without evidence of infection. Symptoms likely gastritis related but given that it doesn't look like she's had an abd U/S, will get this to make sure no gallbladder/biliary tree etiology vs kidney etiology. Doubt UTI given lack of concerning findings on U/A. Will give pepcid, zofran, GI cocktail, and fluids, then reassess shortly.   7:03 PM Abd U/S showing hyperechoic lesions on the liver, suspicious for cavernous hemangiomas, some of which have been seen in the past; further eval with MRI could definitively characterize them, will have her f/up with GI for this finding; otherwise no acute findings, no gallstones/biliary tree etiology. Pt feeling better, tolerating PO well here. Overall, symptoms consistent with  gastritis/GERD which is already a known diagnosis for her, and her H.pylori infection is likely the cause of some of this. Advised taking the medications she had prescribed to her (including zantac, which has previously been prescribed but she's not taking it). Discussed diet/lifestyle modifications for symptoms, will send home with zofran rx, advised tylenol and avoidance/sparing use of NSAIDs only on full stomach, discussed other OTC remedies for symptomatic relief, and f/up with her GI specialist in 5-7 days for recheck of symptoms and ongoing evaluation/management. I explained the diagnosis and have given explicit precautions to return to the ER including for any other new or worsening symptoms. The patient understands and accepts the medical plan as it's been dictated and I have answered their questions. Discharge instructions concerning home care and prescriptions have been given. The patient is STABLE and is discharged to home in good condition.      Final Clinical Impressions(s) / ED Diagnoses   Final diagnoses:  Upper abdominal pain  Nausea and vomiting in adult patient  Chronic gastritis, presence of bleeding unspecified, unspecified gastritis type  H. pylori infection  Thrombocytopenia [facility])    ED Discharge Orders        Ordered    ondansetron (ZOFRAN ODT) 4 MG disintegrating tablet  Every 8 hours PRN     05/02/18 [address] [place], [place] 05/02/18 1904    Arby Barrette, MD 05/05/18 1750

## 2018-05-02 NOTE — ED Notes (Addendum)
Patient transported to US 

## 2018-05-02 NOTE — ED Triage Notes (Signed)
C/o LUQ pain since having a colonoscopy last month-states she was seen by GI on 5/16-meds called in but pt did not pick up rx and does not recall dx-NAD-steady gait

## 2018-05-02 NOTE — Discharge Instructions (Addendum)
Your work up today has been reassuring, your ultrasound shows that you have some small spots on your liver, which have been present for a while, but will need to be followed up by your gastroenterologist. Your abdominal pain is likely from gastritis and your H. Pylori infection. It's very important that you start taking the medications prescribed to you by your gastroenterologist. Take all of your usual home medications. You will need to avoid spicy/fatty/acidic foods, avoid soda/coffee/tea/alcohol. Avoid laying down flat within 30 minutes of eating. Avoid NSAIDs like ibuprofen/aleve/motrin/etc on an empty stomach. May consider using over the counter tums/maalox as needed for additional relief. Use zofran as directed as needed for nausea. Use tylenol as needed for pain. Follow up with your gastroenterologist in 5-7 days for recheck of symptoms and ongoing management of your abdominal pain. Return to the ER for changes or worsening symptoms.  Abdominal (belly) pain can be caused by many things. Your caregiver performed an examination and possibly ordered blood/urine tests and imaging (CT scan, x-rays, ultrasound). Many cases can be observed and treated at home after initial evaluation in the emergency department. Even though you are being discharged home, abdominal pain can be unpredictable. Therefore, you need a repeated exam if your pain does not resolve, returns, or worsens. Most patients with abdominal pain don't have to be admitted to the hospital or have surgery, but serious problems like appendicitis and gallbladder attacks can start out as nonspecific pain. Many abdominal conditions cannot be diagnosed in one visit, so follow-up evaluations are very important. SEEK IMMEDIATE MEDICAL ATTENTION IF YOU DEVELOP ANY OF THE FOLLOWING SYMPTOMS: The pain does not go away or becomes severe.  A temperature above 101 develops.  Repeated vomiting occurs (multiple episodes).  The pain becomes localized to portions  of the abdomen. The right side could possibly be appendicitis. In an adult, the left lower portion of the abdomen could be colitis or diverticulitis.  Blood is being passed in stools or vomit (bright red or black tarry stools).  Return also if you develop chest pain, difficulty breathing, dizziness or fainting, or become confused, poorly responsive, or inconsolable (young children). The constipation stays for more than 4 days.  There is belly (abdominal) or rectal pain.  You do not seem to be getting better.

## 2020-07-14 ENCOUNTER — Other Ambulatory Visit: Payer: Self-pay

## 2020-07-14 ENCOUNTER — Encounter (HOSPITAL_BASED_OUTPATIENT_CLINIC_OR_DEPARTMENT_OTHER): Payer: Self-pay

## 2020-07-14 ENCOUNTER — Emergency Department (HOSPITAL_BASED_OUTPATIENT_CLINIC_OR_DEPARTMENT_OTHER)
Admission: EM | Admit: 2020-07-14 | Discharge: 2020-07-14 | Disposition: A | Discharge status: Home / Self Care | Payer: Medicare Other | Attending: Emergency Medicine | Admitting: Emergency Medicine

## 2020-07-14 ENCOUNTER — Emergency Department (HOSPITAL_BASED_OUTPATIENT_CLINIC_OR_DEPARTMENT_OTHER): Payer: Medicare Other

## 2020-07-14 DIAGNOSIS — Z794 Long term (current) use of insulin: Secondary | ICD-10-CM | POA: Insufficient documentation

## 2020-07-14 DIAGNOSIS — R0789 Other chest pain: Secondary | ICD-10-CM | POA: Diagnosis present

## 2020-07-14 DIAGNOSIS — Z87891 Personal history of nicotine dependence: Secondary | ICD-10-CM | POA: Insufficient documentation

## 2020-07-14 DIAGNOSIS — J45909 Unspecified asthma, uncomplicated: Secondary | ICD-10-CM | POA: Insufficient documentation

## 2020-07-14 DIAGNOSIS — I10 Essential (primary) hypertension: Secondary | ICD-10-CM | POA: Diagnosis not present

## 2020-07-14 DIAGNOSIS — E119 Type 2 diabetes mellitus without complications: Secondary | ICD-10-CM | POA: Insufficient documentation

## 2020-07-14 DIAGNOSIS — R05 Cough: Secondary | ICD-10-CM | POA: Diagnosis not present

## 2020-07-14 DIAGNOSIS — Z79890 Hormone replacement therapy (postmenopausal): Secondary | ICD-10-CM | POA: Insufficient documentation

## 2020-07-14 DIAGNOSIS — Z79899 Other long term (current) drug therapy: Secondary | ICD-10-CM | POA: Diagnosis not present

## 2020-07-14 DIAGNOSIS — E039 Hypothyroidism, unspecified: Secondary | ICD-10-CM | POA: Diagnosis not present

## 2020-07-14 LAB — CBC
HCT: 40.9 % (ref 36.0–46.0)
Hemoglobin: 13.6 g/dL (ref 12.0–15.0)
MCH: 29.2 pg (ref 26.0–34.0)
MCHC: 33.3 g/dL (ref 30.0–36.0)
MCV: 88 fL (ref 80.0–100.0)
Platelets: 163 10*3/uL (ref 150–400)
RBC: 4.65 MIL/uL (ref 3.87–5.11)
RDW: 13.3 % (ref 11.5–15.5)
WBC: 4.1 10*3/uL (ref 4.0–10.5)
nRBC: 0 % (ref 0.0–0.2)

## 2020-07-14 LAB — TROPONIN I (HIGH SENSITIVITY)
Troponin I (High Sensitivity): 4 ng/L (ref <18)
Troponin I (High Sensitivity): 5 ng/L (ref <18)

## 2020-07-14 LAB — BASIC METABOLIC PANEL
Anion gap: 11 (ref 5–15)
BUN: 14 mg/dL (ref 8–23)
CO2: 25 mmol/L (ref 22–32)
Calcium: 9.2 mg/dL (ref 8.9–10.3)
Chloride: 101 mmol/L (ref 98–111)
Creatinine, Ser: 0.65 mg/dL (ref 0.44–1.00)
GFR calc Af Amer: >60 mL/min (ref >60)
GFR calc non Af Amer: >60 mL/min (ref >60)
Glucose, Bld: 322 mg/dL — ABNORMAL HIGH (ref 70–99)
Potassium: 4.2 mmol/L (ref 3.5–5.1)
Sodium: 137 mmol/L (ref 135–145)

## 2020-07-14 MED ORDER — HYDROMORPHONE HCL 1 MG/ML IJ SOLN
1.0000 mg | Freq: Once | INTRAMUSCULAR | Status: AC
  Administered 2020-07-14: 1 mg via INTRAMUSCULAR
  Filled 2020-07-14: qty 1

## 2020-07-14 MED ORDER — SODIUM CHLORIDE 0.9% FLUSH
3.0000 mL | Freq: Once | INTRAVENOUS | Status: DC
  Filled 2020-07-14: qty 3

## 2020-07-14 MED ORDER — TRAMADOL HCL 50 MG PO TABS: 50.0000 mg | ORAL_TABLET | Freq: Three times a day (TID) | ORAL | 0 refills | Status: AC | PRN

## 2020-07-14 MED ORDER — KETOROLAC TROMETHAMINE 15 MG/ML IJ SOLN
15.0000 mg | Freq: Once | INTRAMUSCULAR | Status: AC
  Administered 2020-07-14: 15 mg via INTRAMUSCULAR
  Filled 2020-07-14: qty 1

## 2020-07-14 NOTE — ED Provider Notes (Signed)
[facility] EMERGENCY DEPARTMENT Provider Note   CSN: [id number] Arrival date & time: 07/14/20  1435     History Chief Complaint  Patient presents with  . Chest Pain    Catherine Bowman is a 64 y.o. female.  HPI   64 year old female with chest pain.  Onset about a week ago.  Pain is in her right upper chest.  Worse with certain movements and deep breathing.  Worse with laying down and sometimes with coughing.  Since it began after receiving a hard hug by family member.  She is concerned but symptoms have persisted.  No fevers, dyspnea, diaphoresis, palpitations, dizziness, lightheadedness, numbness/tingling or edema.  Past Medical History:  Diagnosis Date  . Asthma   . Diabetes mellitus   . Hypertension   . Hypothyroid     There are no problems to display for this patient.   Past Surgical History:  Procedure Laterality Date  . ABDOMINAL HYSTERECTOMY    . APPENDECTOMY    . CORONARY STENT INTERVENTION    . TONSILLECTOMY       OB History   No obstetric history on file.     No family history on file.  Social History   Tobacco Use  . Smoking status: Former Games developer  . Smokeless tobacco: Never Used  Vaping Use  . Vaping Use: Never used  Substance Use Topics  . Alcohol use: No  . Drug use: No    Home Medications Prior to Admission medications   Medication Sig Start Date End Date Taking? Authorizing Provider  Albuterol (PROVENTIL IN) Inhale into the lungs.    [provider]  benazepril (LOTENSIN) 40 MG tablet Take 40 mg by mouth daily.      [provider]  gabapentin (NEURONTIN) 100 MG capsule Take 100 mg by mouth 3 (three) times daily.      [provider]  glipiZIDE (GLUCOTROL) 5 MG tablet Take 5 mg by mouth 2 (two) times daily before a meal.      [provider]  insulin glargine (LANTUS) 100 UNIT/ML injection Inject 80 Units into the skin at bedtime.      [provider]  insulin lispro (HUMALOG) 100  UNIT/ML injection Inject 4 Units into the skin 3 (three) times daily before meals.      [provider]  levothyroxine (SYNTHROID, LEVOTHROID) 150 MCG tablet Take 150 mcg by mouth daily.      [provider]  METFORMIN HCL PO Take by mouth.    [provider]  Montelukast Sodium (SINGULAIR PO) Take by mouth.    [provider]  ondansetron (ZOFRAN ODT) 4 MG disintegrating tablet Take 1 tablet (4 mg total) by mouth every 8 (eight) hours as needed for nausea or vomiting. [address]   [address], [place], [place]    Allergies    Patient has no known allergies.  Review of Systems   Review of Systems All systems reviewed and negative, other than as noted in HPI.  Physical Exam Updated Vital Signs BP (!) 151/105 (BP Location: Right Arm)   Pulse 90   Temp 98.4 F (36.9 C) (Oral)   Resp 20   Ht 5' (1.524 m)   Wt 68 kg   SpO2 99%   BMI 29.29 kg/m   Physical Exam Vitals and nursing note reviewed.  Constitutional:      General: She is not in acute distress.    Appearance: She is well-developed.  HENT:     Head: Normocephalic and  atraumatic.  Eyes:     General:        Right eye: No discharge.        Left eye: No discharge.     Conjunctiva/sclera: Conjunctivae normal.  Cardiovascular:     Rate and Rhythm: Normal rate and regular rhythm.     Heart sounds: Normal heart sounds. No murmur heard.  No friction rub. No gallop.   Pulmonary:     Effort: Pulmonary effort is normal. No respiratory distress.     Breath sounds: Normal breath sounds.  Chest:     Chest wall: Tenderness present.  Abdominal:     General: There is no distension.     Palpations: Abdomen is soft.     Tenderness: There is no abdominal tenderness.  Musculoskeletal:        General: No tenderness.     Cervical back: Neck supple.  Skin:    General: Skin is warm and dry.  Neurological:     Mental Status: She is alert.  Psychiatric:        Behavior: Behavior normal.        Thought  Content: Thought content normal.     ED Results / Procedures / Treatments   Labs (all labs ordered are listed, but only abnormal results are displayed) Labs Reviewed  BASIC METABOLIC PANEL - Abnormal; Notable for the following components:      Result Value   Glucose, Bld 322 (*)    All other components within normal limits  CBC  TROPONIN I (HIGH SENSITIVITY)  TROPONIN I (HIGH SENSITIVITY)    EKG EKG Interpretation  Date/Time:  Wednesday July 14 2020 14:54:23 EDT Ventricular Rate:  95 PR Interval:  188 QRS Duration: 88 QT Interval:  366 QTC Calculation: 459 R Axis:   42 Text Interpretation: Normal sinus rhythm Non-specific ST-t changes Confirmed by Raeford Razor [phone]) on 07/14/2020 7:45:23 PM   Radiology DG Chest 2 View  Result Date: 07/14/2020 CLINICAL DATA:  Chest pain for 3 days, former smoker with cardiac stenting EXAM: CHEST - 2 VIEW COMPARISON:  July 08, 2020 FINDINGS: Cardiomediastinal contours and hilar structures are normal. Lungs are clear.  No sign of pleural effusion. Chronic elevation of the RIGHT hemidiaphragm. No acute musculoskeletal finding. IMPRESSION: No acute cardiopulmonary disease. Electronically Signed   By: Donzetta Tylia Ewell M.D.   On: 07/14/2020 15:24    Procedures Procedures (including critical care time)  Medications Ordered in ED Medications  sodium chloride flush (NS) 0.9 % injection 3 mL (3 mLs Intravenous Not Given 07/14/20 1939)    ED Course  I have reviewed the triage vital signs and the nursing notes.  Pertinent labs & imaging results that were available during my care of the patient were reviewed by me and considered in my medical decision making (see chart for details).    MDM Rules/Calculators/A&P                          64 year old female with chest pain.  Very consistent with chest wall etiology.  Reproducible with palpation and certain movements.  Doubt ACS, PE, dissection of the emergent process.  Plan symptomatic treatment.   Return precautions discussed. Final Clinical Impression(s) / ED Diagnoses Final diagnoses:  Chest wall pain    Rx / DC Orders ED Discharge Orders    None       Raeford Razor, MD 07/18/20 1153

## 2020-07-14 NOTE — ED Triage Notes (Addendum)
Pt c/o CP x 3 days-NAD-steady gait-pt later stated she was seen at [facility] ED for CP last week

## 2020-10-10 IMAGING — DX DG CHEST 2V
2 series · 2 of 2 positions shown · non-contrast
Comparison: July 08, 2020

CLINICAL DATA: Chest pain for 3 days, former smoker with cardiac
stenting

EXAM:
CHEST - 2 VIEW

[chest pa]
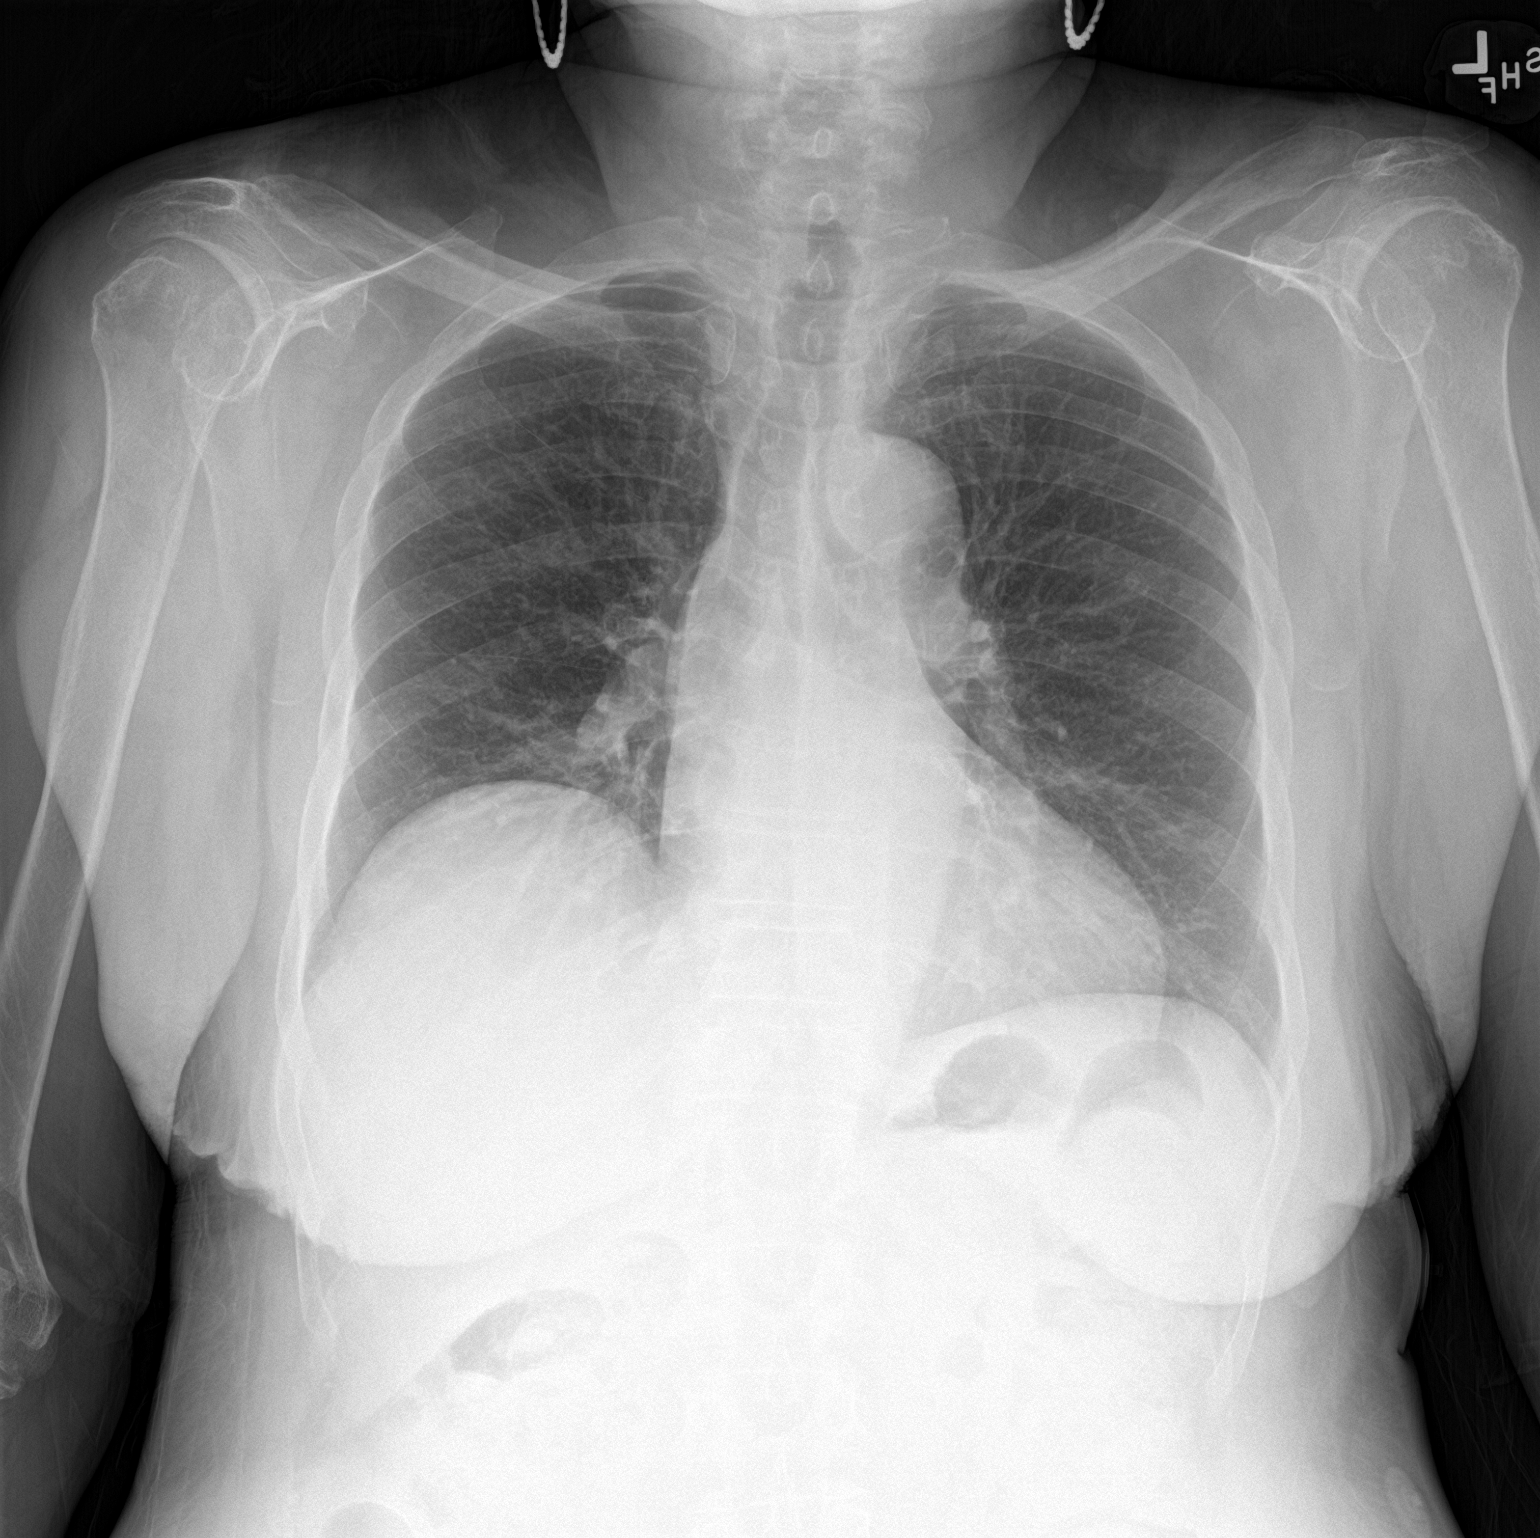

[chest lat]
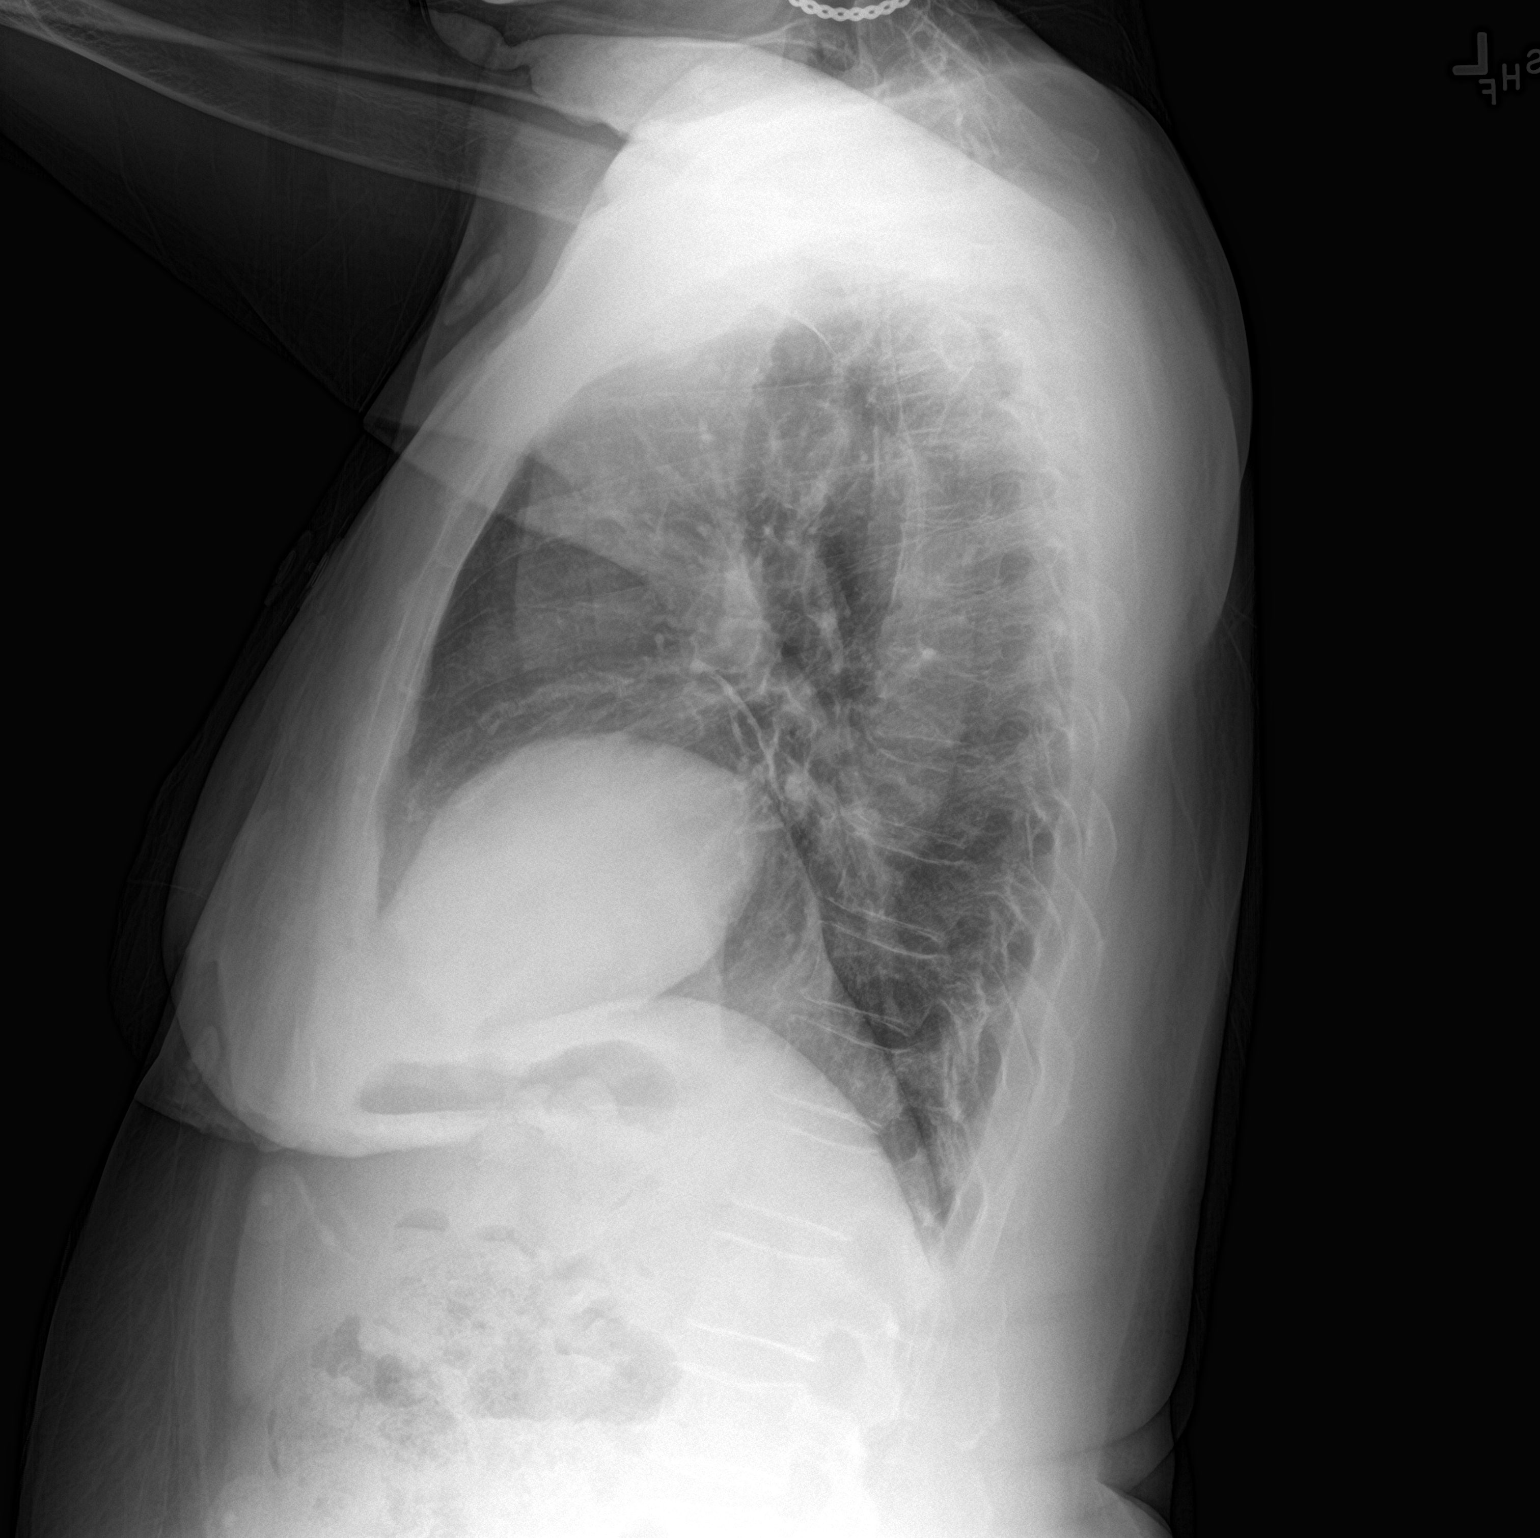

[2 of 2 positions shown; findings below may reference images not displayed]

FINDINGS: Cardiomediastinal contours and hilar structures are normal.

Lungs are clear.  No sign of pleural effusion.

Chronic elevation of the RIGHT hemidiaphragm.

No acute musculoskeletal finding.
IMPRESSION: No acute cardiopulmonary disease.

## 2023-08-12 DEATH — deceased
